# Patient Record
Sex: Female | Born: 1955 | Race: White | Hispanic: No | Smoking: Current every day smoker
Health system: Southern US, Community
[De-identification: ages and names within clinical notes are randomized; demographics above are authoritative.]

## PROBLEM LIST (undated history)

## (undated) DIAGNOSIS — F32A Depression, unspecified: Secondary | ICD-10-CM

## (undated) DIAGNOSIS — E119 Type 2 diabetes mellitus without complications: Secondary | ICD-10-CM

## (undated) DIAGNOSIS — E785 Hyperlipidemia, unspecified: Secondary | ICD-10-CM

## (undated) DIAGNOSIS — J189 Pneumonia, unspecified organism: Secondary | ICD-10-CM

## (undated) DIAGNOSIS — F329 Major depressive disorder, single episode, unspecified: Secondary | ICD-10-CM

## (undated) DIAGNOSIS — M797 Fibromyalgia: Secondary | ICD-10-CM

## (undated) DIAGNOSIS — I10 Essential (primary) hypertension: Secondary | ICD-10-CM

## (undated) HISTORY — PX: TUBAL LIGATION: SHX77

## (undated) HISTORY — PX: CARDIAC CATHETERIZATION: SHX172

## (undated) HISTORY — PX: TONSILLECTOMY: SUR1361

## (undated) HISTORY — PX: CARPAL TUNNEL RELEASE: SHX101

## (undated) HISTORY — PX: FRACTURE SURGERY: SHX138

## (undated) SURGERY — Surgical Case
Anesthesia: *Unknown

---

## 2012-09-02 ENCOUNTER — Inpatient Hospital Stay (HOSPITAL_COMMUNITY)
Admission: EM | Admit: 2012-09-02 | Discharge: 2012-09-06 | DRG: 871 | Disposition: A | Discharge status: Home / Self Care | Payer: Medicare Other | Attending: Internal Medicine | Admitting: Internal Medicine

## 2012-09-02 ENCOUNTER — Emergency Department (HOSPITAL_COMMUNITY): Payer: Medicare Other

## 2012-09-02 ENCOUNTER — Encounter (HOSPITAL_COMMUNITY): Payer: Self-pay

## 2012-09-02 DIAGNOSIS — A419 Sepsis, unspecified organism: Principal | ICD-10-CM | POA: Diagnosis present

## 2012-09-02 DIAGNOSIS — I10 Essential (primary) hypertension: Secondary | ICD-10-CM

## 2012-09-02 DIAGNOSIS — F32A Depression, unspecified: Secondary | ICD-10-CM | POA: Diagnosis present

## 2012-09-02 DIAGNOSIS — R6510 Systemic inflammatory response syndrome (SIRS) of non-infectious origin without acute organ dysfunction: Secondary | ICD-10-CM | POA: Diagnosis present

## 2012-09-02 DIAGNOSIS — J189 Pneumonia, unspecified organism: Secondary | ICD-10-CM | POA: Diagnosis present

## 2012-09-02 DIAGNOSIS — IMO0002 Reserved for concepts with insufficient information to code with codable children: Secondary | ICD-10-CM | POA: Diagnosis present

## 2012-09-02 DIAGNOSIS — R748 Abnormal levels of other serum enzymes: Secondary | ICD-10-CM | POA: Diagnosis present

## 2012-09-02 DIAGNOSIS — Z886 Allergy status to analgesic agent status: Secondary | ICD-10-CM

## 2012-09-02 DIAGNOSIS — Z79899 Other long term (current) drug therapy: Secondary | ICD-10-CM

## 2012-09-02 DIAGNOSIS — B370 Candidal stomatitis: Secondary | ICD-10-CM | POA: Diagnosis present

## 2012-09-02 DIAGNOSIS — I129 Hypertensive chronic kidney disease with stage 1 through stage 4 chronic kidney disease, or unspecified chronic kidney disease: Secondary | ICD-10-CM | POA: Diagnosis present

## 2012-09-02 DIAGNOSIS — F329 Major depressive disorder, single episode, unspecified: Secondary | ICD-10-CM

## 2012-09-02 DIAGNOSIS — R6520 Severe sepsis without septic shock: Secondary | ICD-10-CM | POA: Diagnosis present

## 2012-09-02 DIAGNOSIS — F419 Anxiety disorder, unspecified: Secondary | ICD-10-CM | POA: Diagnosis present

## 2012-09-02 DIAGNOSIS — E1129 Type 2 diabetes mellitus with other diabetic kidney complication: Secondary | ICD-10-CM | POA: Diagnosis present

## 2012-09-02 DIAGNOSIS — E1165 Type 2 diabetes mellitus with hyperglycemia: Secondary | ICD-10-CM | POA: Diagnosis present

## 2012-09-02 DIAGNOSIS — F411 Generalized anxiety disorder: Secondary | ICD-10-CM | POA: Diagnosis present

## 2012-09-02 DIAGNOSIS — E860 Dehydration: Secondary | ICD-10-CM | POA: Diagnosis present

## 2012-09-02 DIAGNOSIS — F17200 Nicotine dependence, unspecified, uncomplicated: Secondary | ICD-10-CM | POA: Diagnosis present

## 2012-09-02 DIAGNOSIS — E876 Hypokalemia: Secondary | ICD-10-CM | POA: Diagnosis not present

## 2012-09-02 DIAGNOSIS — Z880 Allergy status to penicillin: Secondary | ICD-10-CM

## 2012-09-02 DIAGNOSIS — R4182 Altered mental status, unspecified: Secondary | ICD-10-CM

## 2012-09-02 DIAGNOSIS — R509 Fever, unspecified: Secondary | ICD-10-CM

## 2012-09-02 DIAGNOSIS — N058 Unspecified nephritic syndrome with other morphologic changes: Secondary | ICD-10-CM | POA: Diagnosis present

## 2012-09-02 DIAGNOSIS — IMO0001 Reserved for inherently not codable concepts without codable children: Secondary | ICD-10-CM | POA: Diagnosis present

## 2012-09-02 DIAGNOSIS — G9340 Encephalopathy, unspecified: Secondary | ICD-10-CM | POA: Diagnosis present

## 2012-09-02 DIAGNOSIS — F3289 Other specified depressive episodes: Secondary | ICD-10-CM | POA: Diagnosis present

## 2012-09-02 DIAGNOSIS — N179 Acute kidney failure, unspecified: Secondary | ICD-10-CM | POA: Diagnosis present

## 2012-09-02 DIAGNOSIS — N390 Urinary tract infection, site not specified: Secondary | ICD-10-CM | POA: Diagnosis present

## 2012-09-02 DIAGNOSIS — N189 Chronic kidney disease, unspecified: Secondary | ICD-10-CM | POA: Diagnosis present

## 2012-09-02 DIAGNOSIS — E785 Hyperlipidemia, unspecified: Secondary | ICD-10-CM | POA: Diagnosis present

## 2012-09-02 HISTORY — DX: Hyperlipidemia, unspecified: E78.5

## 2012-09-02 HISTORY — DX: Pneumonia, unspecified organism: J18.9

## 2012-09-02 HISTORY — DX: Fibromyalgia: M79.7

## 2012-09-02 HISTORY — DX: Essential (primary) hypertension: I10

## 2012-09-02 HISTORY — DX: Depression, unspecified: F32.A

## 2012-09-02 HISTORY — DX: Type 2 diabetes mellitus without complications: E11.9

## 2012-09-02 HISTORY — DX: Major depressive disorder, single episode, unspecified: F32.9

## 2012-09-02 LAB — COMPREHENSIVE METABOLIC PANEL
Alkaline Phosphatase: 64 U/L (ref 39–117)
BUN: 61 mg/dL — ABNORMAL HIGH (ref 6–23)
Chloride: 104 mEq/L (ref 96–112)
Creatinine, Ser: 4.3 mg/dL — ABNORMAL HIGH (ref 0.50–1.10)
GFR calc Af Amer: 12 mL/min — ABNORMAL LOW (ref >90)
Glucose, Bld: 188 mg/dL — ABNORMAL HIGH (ref 70–99)
Potassium: 4 mEq/L (ref 3.5–5.1)
Total Bilirubin: 0.7 mg/dL (ref 0.3–1.2)

## 2012-09-02 LAB — CBC WITH DIFFERENTIAL/PLATELET
Basophils Absolute: 0 10*3/uL (ref 0.0–0.1)
Basophils Relative: 0 % (ref 0–1)
HCT: 38.3 % (ref 36.0–46.0)
Lymphocytes Relative: 18 % (ref 12–46)
MCHC: 33.2 g/dL (ref 30.0–36.0)
Neutro Abs: 5.7 10*3/uL (ref 1.7–7.7)
Neutrophils Relative %: 65 % (ref 43–77)
Platelets: 159 10*3/uL (ref 150–400)
RDW: 14.5 % (ref 11.5–15.5)
WBC: 8.8 10*3/uL (ref 4.0–10.5)

## 2012-09-02 LAB — URINALYSIS, ROUTINE W REFLEX MICROSCOPIC
Glucose, UA: NEGATIVE mg/dL
Protein, ur: 30 mg/dL — AB
Specific Gravity, Urine: >1.03 — ABNORMAL HIGH (ref 1.005–1.030)

## 2012-09-02 LAB — TROPONIN I: Troponin I: <0.3 ng/mL (ref <0.30)

## 2012-09-02 LAB — URINE MICROSCOPIC-ADD ON

## 2012-09-02 LAB — RAPID URINE DRUG SCREEN, HOSP PERFORMED
Cocaine: NOT DETECTED
Opiates: NOT DETECTED
Tetrahydrocannabinol: NOT DETECTED

## 2012-09-02 LAB — SALICYLATE LEVEL: Salicylate Lvl: <2 mg/dL — ABNORMAL LOW (ref 2.8–20.0)

## 2012-09-02 LAB — ACETAMINOPHEN LEVEL: Acetaminophen (Tylenol), Serum: <15 ug/mL (ref 10–30)

## 2012-09-02 LAB — GLUCOSE, CAPILLARY: Glucose-Capillary: 155 mg/dL — ABNORMAL HIGH (ref 70–99)

## 2012-09-02 LAB — CK: Total CK: 445 U/L — ABNORMAL HIGH (ref 7–177)

## 2012-09-02 LAB — LACTIC ACID, PLASMA: Lactic Acid, Venous: 0.9 mmol/L (ref 0.5–2.2)

## 2012-09-02 MED ORDER — INSULIN ASPART 100 UNIT/ML ~~LOC~~ SOLN
0.0000 [IU] | SUBCUTANEOUS | Status: DC
  Administered 2012-09-02 – 2012-09-03 (×2): 2 [IU] via SUBCUTANEOUS
  Administered 2012-09-03 (×2): 1 [IU] via SUBCUTANEOUS

## 2012-09-02 MED ORDER — NALOXONE HCL 0.4 MG/ML IJ SOLN
INTRAMUSCULAR | Status: AC
  Administered 2012-09-02: 0.4 mg via INTRAVENOUS
  Filled 2012-09-02: qty 1

## 2012-09-02 MED ORDER — SODIUM CHLORIDE 0.9 % IV SOLN
INTRAVENOUS | Status: DC
  Administered 2012-09-02 – 2012-09-03 (×3): via INTRAVENOUS
  Administered 2012-09-04: 1000 mL via INTRAVENOUS
  Administered 2012-09-04 – 2012-09-05 (×2): via INTRAVENOUS

## 2012-09-02 MED ORDER — ONDANSETRON HCL 4 MG/2ML IJ SOLN: 4.0000 mg | Freq: Four times a day (QID) | INTRAMUSCULAR | Status: DC | PRN

## 2012-09-02 MED ORDER — ENOXAPARIN SODIUM 30 MG/0.3ML ~~LOC~~ SOLN
30.0000 mg | SUBCUTANEOUS | Status: DC
  Administered 2012-09-02: 30 mg via SUBCUTANEOUS
  Filled 2012-09-02: qty 0.3

## 2012-09-02 MED ORDER — ALBUTEROL SULFATE (5 MG/ML) 0.5% IN NEBU: 2.5000 mg | INHALATION_SOLUTION | RESPIRATORY_TRACT | Status: DC | PRN

## 2012-09-02 MED ORDER — SODIUM CHLORIDE 0.9 % IV SOLN: INTRAVENOUS | Status: AC

## 2012-09-02 MED ORDER — ACETAMINOPHEN 325 MG PO TABS
650.0000 mg | ORAL_TABLET | Freq: Four times a day (QID) | ORAL | Status: DC | PRN
  Administered 2012-09-03 – 2012-09-05 (×4): 650 mg via ORAL
  Filled 2012-09-02 (×4): qty 2

## 2012-09-02 MED ORDER — SODIUM CHLORIDE 0.9 % IV SOLN: INTRAVENOUS | Status: DC

## 2012-09-02 MED ORDER — CIPROFLOXACIN IN D5W 400 MG/200ML IV SOLN
400.0000 mg | INTRAVENOUS | Status: DC
  Administered 2012-09-03: 400 mg via INTRAVENOUS
  Filled 2012-09-02 (×3): qty 200

## 2012-09-02 MED ORDER — ACETAMINOPHEN 500 MG PO TABS
1000.0000 mg | ORAL_TABLET | Freq: Once | ORAL | Status: AC
  Administered 2012-09-02: 1000 mg via ORAL
  Filled 2012-09-02: qty 2

## 2012-09-02 MED ORDER — CIPROFLOXACIN IN D5W 400 MG/200ML IV SOLN
400.0000 mg | Freq: Once | INTRAVENOUS | Status: AC
  Administered 2012-09-02: 400 mg via INTRAVENOUS
  Filled 2012-09-02: qty 200

## 2012-09-02 MED ORDER — ONDANSETRON HCL 4 MG PO TABS: 4.0000 mg | ORAL_TABLET | Freq: Four times a day (QID) | ORAL | Status: DC | PRN

## 2012-09-02 MED ORDER — NALOXONE HCL 0.4 MG/ML IJ SOLN: 0.4000 mg | Freq: Once | INTRAMUSCULAR | Status: AC

## 2012-09-02 MED ORDER — SODIUM CHLORIDE 0.9 % IV BOLUS (SEPSIS)
500.0000 mL | Freq: Once | INTRAVENOUS | Status: AC
  Administered 2012-09-02: 500 mL via INTRAVENOUS

## 2012-09-02 NOTE — ED Notes (Signed)
EMS reports pt very drowsy.  Reports son found pt on floor around 4am naked, yelling for her sister to help her.  Reports her sister lives in [place].  Reports yesterday pt was c/o feeling very cold.  Oral temp today 101.1 per EMS.  Also says pt was saying that she had been  talking to her sister on the phone yesterday, but pt doesn't have a phone.  EMS reports history of heroin addiction.  EMS says pt told them that she has only taken 2 percocet last night around 9:30.    Pt has recently been in [facility] for pneumonia.

## 2012-09-02 NOTE — Progress Notes (Signed)
ANTIBIOTIC CONSULT NOTE  Pharmacy Consult for Cipro Indication: UTI/SIRS  Allergies  Allergen Reactions  . Penicillins Anaphylaxis  . Nsaids Other (See Comments)    Throat swells.    Patient Measurements: Height: 5\' 7"  (170.2 cm) Weight: 251 lb (113.853 kg) IBW/kg (Calculated) : 61.6  Vital Signs: Temp: 98.8 F (37.1 C) (03/14 1547) Temp src: Oral (03/14 1547) BP: 153/59 mmHg (03/14 1547) Pulse Rate: 88 (03/14 1547) Intake/Output from previous day:   Intake/Output from this shift: Total I/O In: 120 [P.O.:120] Out: -   Labs:  Recent Labs  09/02/12 1036 09/02/12 1037  WBC 8.8  --   HGB 12.7  --   PLT 159  --   CREATININE  --  4.30*   Estimated Creatinine Clearance: 19.5 ml/min (by C-G formula based on Cr of 4.3). No results found for this basename: VANCOTROUGH, Leodis Binet, VANCORANDOM, GENTTROUGH, GENTPEAK, GENTRANDOM, TOBRATROUGH, TOBRAPEAK, TOBRARND, AMIKACINPEAK, AMIKACINTROU, AMIKACIN,  in the last 72 hours   Microbiology: Recent Results (from the past 720 hour(s))  CULTURE, BLOOD (ROUTINE X 2)     Status: None   Collection Time    09/02/12 12:57 PM      Result Value Range Status   Specimen Description Blood RIGHT ARM   Final   Special Requests BOTTLES DRAWN AEROBIC AND ANAEROBIC 8 CC EACH   Final   Culture PENDING   Incomplete   Report Status PENDING   Incomplete  CULTURE, BLOOD (ROUTINE X 2)     Status: None   Collection Time    09/02/12  1:10 PM      Result Value Range Status   Specimen Description Blood LEFT HAND   Final   Special Requests BOTTLES DRAWN AEROBIC ONLY 8 CC   Final   Culture PENDING   Incomplete   Report Status PENDING   Incomplete    Medical History: Past Medical History  Diagnosis Date  . Fibromyalgia   . Hypertension   . Hyperlipidemia   . Pneumonia   . Diabetes mellitus without complication   . Depression     Medications:  Scheduled:  . sodium chloride   Intravenous STAT  . [COMPLETED] acetaminophen  1,000 mg Oral  Once  . [COMPLETED] ciprofloxacin  400 mg Intravenous Once  . enoxaparin (LOVENOX) injection  30 mg Subcutaneous Q24H  . insulin aspart  0-9 Units Subcutaneous Q4H  . [COMPLETED] naloxone  0.4 mg Intravenous Once  . [COMPLETED] sodium chloride  500 mL Intravenous Once   Assessment: Okay for Protocol Estimated Creatinine Clearance: 19.5 ml/min (by C-G formula based on Cr of 4.3). Patient received 400mg  IV Cipro earlier today.  Goal of Therapy:  Eradicate infection.  Plan:  Cipro 400mg  IV every 24 hours. Follow up culture results  Mady Gemma 09/02/2012,6:46 PM

## 2012-09-02 NOTE — ED Provider Notes (Signed)
History     CSN: [id number]  Arrival date & time 09/02/12  1016   First MD Initiated Contact with Patient 09/02/12 1034      Chief Complaint  Patient presents with  . Altered Mental Status     HPI Pt was seen at 1020.   Per EMS, family and pt report, c/o gradual onset and persistence of constant generalized fatigue and "chills" since last night.  Pt's family states they found her on the floor this morning "naked and yelling for help."  Pt states she was recently discharged from [facility] the first week of February for dx pneumonia.  Pt does endorse hx of chronic pain, with LD percocet last night.  Denies CP/palpitations, no SOB/cough, no abd pain, no N/V/D, no back pain, no fevers, no rash, no syncope.     Past Medical History  Diagnosis Date  . Fibromyalgia   . Hypertension   . Hyperlipidemia   . Pneumonia   . Diabetes mellitus without complication   . Depression     History reviewed. No pertinent past surgical history.   History  Substance Use Topics  . Smoking status: Never Smoker   . Smokeless tobacco: Not on file  . Alcohol Use: No      Review of Systems ROS: Statement: All systems negative except as marked or noted in the HPI; Constitutional: Negative for fever and +chills, fatigue. ; ; Eyes: Negative for eye pain, redness and discharge. ; ; ENMT: Negative for ear pain, hoarseness, nasal congestion, sinus pressure and sore throat. ; ; Cardiovascular: Negative for chest pain, palpitations, diaphoresis, dyspnea and peripheral edema. ; ; Respiratory: Negative for cough, wheezing and stridor. ; ; Gastrointestinal: Negative for nausea, vomiting, diarrhea, abdominal pain, blood in stool, hematemesis, jaundice and rectal bleeding. . ; ; Genitourinary: Negative for dysuria, flank pain and hematuria. ; ; Musculoskeletal: Negative for back pain and neck pain. Negative for swelling and trauma.; ; Skin: Negative for pruritus, rash, abrasions, blisters, bruising and skin lesion.; ;  Neuro: Negative for headache, lightheadedness and neck stiffness. Negative for weakness, altered level of consciousness , altered mental status, extremity weakness, paresthesias, involuntary movement, seizure and syncope.       Allergies  Penicillins and Nsaids  Home Medications   Current Outpatient Rx  Name  Route  Sig  Dispense  Refill  . amLODipine (NORVASC) 10 MG tablet   Oral   Take 10 mg by mouth daily.         Marland Kitchen atorvastatin (LIPITOR) 40 MG tablet   Oral   Take 40 mg by mouth daily.         . benazepril-hydrochlorthiazide (LOTENSIN HCT) 20-25 MG per tablet   Oral   Take 1 tablet by mouth 2 (two) times daily.         . carvedilol (COREG) 25 MG tablet   Oral   Take 25 mg by mouth 2 (two) times daily with a meal.         . clonazePAM (KLONOPIN) 2 MG tablet   Oral   Take 2 mg by mouth 2 (two) times daily.         . cloNIDine (CATAPRES) 0.3 MG tablet   Oral   Take 0.3 mg by mouth 2 (two) times daily.         Marland Kitchen losartan (COZAAR) 100 MG tablet   Oral   Take 100 mg by mouth daily.         . metFORMIN (GLUCOPHAGE-XR) 500 MG 24  hr tablet   Oral   Take 1,000 mg by mouth 2 (two) times daily.         Marland Kitchen oxyCODONE-acetaminophen (PERCOCET) 10-325 MG per tablet   Oral   Take 1 tablet by mouth 4 (four) times daily.         . sertraline (ZOLOFT) 100 MG tablet   Oral   Take 200 mg by mouth daily.           BP 125/58  Pulse 100  Temp(Src) 100.3 F (37.9 C) (Oral)  Resp 23  SpO2 91%  Physical Exam 1025: Physical examination:  Nursing notes reviewed; Vital signs and O2 SAT reviewed;  Constitutional: Well developed, Well nourished, In no acute distress; Head:  Normocephalic, atraumatic; Eyes: EOMI, PERRL, No scleral icterus; ENMT: Mouth and pharynx normal, Mucous membranes dry. +lips and tongue dry and cracked.; Neck: Supple, Full range of motion, No lymphadenopathy. No meningeal signs; Cardiovascular: Regular rate and rhythm, No murmur, rub, or  gallop; Respiratory: Breath sounds clear & equal bilaterally, No rales, rhonchi, wheezes.  Speaking full sentences with ease, Normal respiratory effort/excursion; Chest: Nontender, Movement normal; Abdomen: Soft, Nontender, Nondistended, Normal bowel sounds; Genitourinary: No CVA tenderness; Extremities: Pulses normal, No tenderness, No edema, No calf edema or asymmetry.; Neuro: Lethargic, but opens her eyes to her name and answers questions appropriately.  Speech slurred. No facial droop. Major CN grossly intact.  Moves all ext on stretcher spontaneously and to command without apparent gross focal motor deficits.; Skin: Color normal, Warm, Dry.   ED Course  Procedures   1100:  Pt lethargic with slurred speech on arrival.  EMS notes pt's family told them she had hx of heroin addiction.  IV narcan given with good effect.  Pt now awake, alert, asking for "something to eat."   1255:  Pt continues awake/alert, talking with ED staff appropriately.  Taking PO fluids well. Rectal temp 101.3; will dose tylenol.  +UTI, UC pending.  VS remain stable.  Pt states her PMD is in [facility] and she was admitted at [facility] the first week of Feb with dx pneumonia.  Has been staying with family members since then, was with a local family member last night.  Dx and testing d/w pt.  Questions answered.  Verb understanding, agreeable to admit.  T/C to [facility]. Rito Ehrlich, case discussed, including:  HPI, pertinent PM/SHx, VS/PE, dx testing, ED course and treatment:  Agreeable to admit, requests to start IV cipro for UTI after obtain UC and [facility] x2, write temporary orders, obtain medical bed to team 1.   MDM  MDM Reviewed: nursing note and vitals Interpretation: labs, ECG, x-ray and CT scan Total time providing critical care: 30-74 minutes. This excludes time spent performing separately reportable procedures and services. Consults: admitting MD   CRITICAL CARE Performed by: Laray Anger Total critical care time:  40 Critical care time was exclusive of separately billable procedures and treating other patients. Critical care was necessary to treat or prevent imminent or life-threatening deterioration. Critical care was time spent personally by me on the following activities: development of treatment plan with patient and/or surrogate as well as nursing, discussions with consultants, evaluation of patient's response to treatment, examination of patient, obtaining history from patient or surrogate, ordering and performing treatments and interventions, ordering and review of laboratory studies, ordering and review of radiographic studies, pulse oximetry and re-evaluation of patient's condition.    Date: 09/02/2012  Rate: 100  Rhythm: sinus tachycardia  QRS Axis: normal  Intervals: normal  ST/T Wave abnormalities: normal  Conduction Disutrbances:none  Narrative Interpretation:   Old EKG Reviewed: none available.  Results for orders placed during the hospital encounter of 09/02/12  CBC WITH DIFFERENTIAL      Result Value Range   WBC 8.8  4.0 - 10.5 K/uL   RBC 4.09  3.87 - 5.11 MIL/uL   Hemoglobin 12.7  12.0 - 15.0 g/dL   HCT 29.5  28.4 - 13.2 %   MCV 93.6  78.0 - 100.0 fL   MCH 31.1  26.0 - 34.0 pg   MCHC 33.2  30.0 - 36.0 g/dL   RDW 44.0  10.2 - 72.5 %   Platelets 159  150 - 400 K/uL   Neutrophils Relative 65  43 - 77 %   Neutro Abs 5.7  1.7 - 7.7 K/uL   Lymphocytes Relative 18  12 - 46 %   Lymphs Abs 1.6  0.7 - 4.0 K/uL   Monocytes Relative 16 (*) 3 - 12 %   Monocytes Absolute 1.4 (*) 0.1 - 1.0 K/uL   Eosinophils Relative 1  0 - 5 %   Eosinophils Absolute 0.1  0.0 - 0.7 K/uL   Basophils Relative 0  0 - 1 %   Basophils Absolute 0.0  0.0 - 0.1 K/uL  ACETAMINOPHEN LEVEL      Result Value Range   Acetaminophen (Tylenol), Serum <15.0  10 - 30 ug/mL  SALICYLATE LEVEL      Result Value Range   Salicylate Lvl <2.0 (*) 2.8 - 20.0 mg/dL  URINE RAPID DRUG SCREEN (HOSP PERFORMED)      Result Value  Range   Opiates NONE DETECTED  NONE DETECTED   Cocaine NONE DETECTED  NONE DETECTED   Benzodiazepines POSITIVE (*) NONE DETECTED   Amphetamines NONE DETECTED  NONE DETECTED   Tetrahydrocannabinol NONE DETECTED  NONE DETECTED   Barbiturates NONE DETECTED  NONE DETECTED  ETHANOL      Result Value Range   Alcohol, Ethyl (B) <11  0 - 11 mg/dL  URINALYSIS, ROUTINE W REFLEX MICROSCOPIC      Result Value Range   Color, Urine YELLOW  YELLOW   APPearance CLEAR  CLEAR   Specific Gravity, Urine >1.030 (*) 1.005 - 1.030   pH 5.5  5.0 - 8.0   Glucose, UA NEGATIVE  NEGATIVE mg/dL   Hgb urine dipstick LARGE (*) NEGATIVE   Bilirubin Urine NEGATIVE  NEGATIVE   Ketones, ur NEGATIVE  NEGATIVE mg/dL   Protein, ur 30 (*) NEGATIVE mg/dL   Urobilinogen, UA 0.2  0.0 - 1.0 mg/dL   Nitrite NEGATIVE  NEGATIVE   Leukocytes, UA TRACE (*) NEGATIVE  COMPREHENSIVE METABOLIC PANEL      Result Value Range   Sodium 140  135 - 145 mEq/L   Potassium 4.0  3.5 - 5.1 mEq/L   Chloride 104  96 - 112 mEq/L   CO2 21  19 - 32 mEq/L   Glucose, Bld 188 (*) 70 - 99 mg/dL   BUN 61 (*) 6 - 23 mg/dL   Creatinine, Ser 3.66 (*) 0.50 - 1.10 mg/dL   Calcium 8.7  8.4 - 44.0 mg/dL   Total Protein 6.9  6.0 - 8.3 g/dL   Albumin 3.4 (*) 3.5 - 5.2 g/dL   AST 32  0 - 37 U/L   ALT 54 (*) 0 - 35 U/L   Alkaline Phosphatase 64  39 - 117 U/L   Total Bilirubin 0.7  0.3 - 1.2  mg/dL   GFR calc non Af Amer 11 (*) >90 mL/min   GFR calc Af Amer 12 (*) >90 mL/min  TROPONIN I      Result Value Range   Troponin I <0.30  <0.30 ng/mL  LACTIC ACID, PLASMA      Result Value Range   Lactic Acid, Venous 0.9  0.5 - 2.2 mmol/L  URINE MICROSCOPIC-ADD ON      Result Value Range   Squamous Epithelial / LPF FEW (*) RARE   WBC, UA TOO NUMEROUS TO COUNT  <3 WBC/hpf   RBC / HPF TOO NUMEROUS TO COUNT  <3 RBC/hpf   Bacteria, UA MANY (*) RARE   Urine-Other YEAST    CK      Result Value Range   Total CK 445 (*) 7 - 177 U/L   Ct Head Wo  Contrast 09/02/2012  *RADIOLOGY REPORT*  Clinical Data: Altered mental status  CT HEAD WITHOUT CONTRAST  Technique:  Contiguous axial images were obtained from the base of the skull through the vertex without contrast.  Comparison: None.  Findings: Image quality degraded by motion.  Multiple images were repeated.  Ventricle size is normal.  Patchy hypodensity in the cerebral white matter bilaterally, consistent with chronic microvascular ischemia. 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  Negative for acute infarct.  Negative for hemorrhage or mass lesion.  Calvarium is intact.  IMPRESSION: Chronic microvascular ischemia.  No acute abnormality.   Original Report Authenticated By: Janeece Riggers, M.D.    Dg Chest Portable 1 View 09/02/2012  *RADIOLOGY REPORT*  Clinical Data: Altered mental status.  PORTABLE CHEST - 1 VIEW  Comparison: None.  Findings: There is cardiomegaly without edema.  There is some airspace disease in the right lung base.  Left lung appears clear. No pneumothorax or pleural effusion.  IMPRESSION:  1.  Right basilar airspace disease could be due to atelectasis, aspiration or pneumonia. 2.  Cardiomegaly.   Original Report Authenticated By: Holley Dexter, M.D.              Laray Anger, DO 09/04/12 1237

## 2012-09-02 NOTE — ED Notes (Signed)
Requesting nourishment, edp notified, npo for now, patient informed

## 2012-09-02 NOTE — H&P (Signed)
[facility] History and Physical  Gail Preston ZOX:[id number] DOB: [birth year] DOA: 09/02/2012  Referring physician: Eden Emms, ER physician PCP: Unclear who her PCP is  Specialists: None  Chief Complaint: Altered mental status  HPI: Gail Preston is a 57 y.o. female  Past medical history of recently treated pneumonia at outside hospital last month. She has been complaining of according to family in the emergency room of generalized fatigue and chills. Patient was found this morning confused and calling for help. She was brought into the emergency room nothing was able to relate somewhat of a store. States that she took an extra Percocet the night before. Urine drug screen interestingly noted benzodiazepines but no Percocet. Chest x-ray noted pneumonia but also cardiomegaly. White count and other lab work was normal with the exception of her renal function which was markedly elevated with a BUN of 61 and a creatinine of 4.3. It is of note that we have no previous labs to compare to. Urine also noted a very large UTI. Patient was given Narcan in the emergency room which immediately led to more awake and alert and with clarity. Hospitalists were called for further evaluation and admission. I advised checking blood cultures and skipping one dose of IV Cipro for UTI. She was transferred to the floor she looked to be much more somnolent. Once patient arrived to the floor, no family was present.  Review of Systems: Patient is quite somnolent. Unable to give me any kind of medical history or review of systems   Past Medical History  Diagnosis Date  . Fibromyalgia   . Hypertension   . Hyperlipidemia   . Pneumonia   . Diabetes mellitus without complication   . Depression    History reviewed. No pertinent past surgical history. Social History:  reports that she has been smoking.  She does not have any smokeless tobacco history on file. She reports that she does not drink alcohol or use illicit  drugs. Unable to clarify patient's home situation. Unable to clarify her baseline function.  Allergies  Allergen Reactions  . Penicillins Anaphylaxis  . Nsaids Other (See Comments)    Throat swells.    Family History  Problem Relation Age of Onset  . Cancer Mother   . Heart disease Father     Prior to Admission medications   Medication Sig Start Date End Date Taking? Authorizing Provider  amLODipine (NORVASC) 10 MG tablet Take 10 mg by mouth daily.   Yes Historical Provider, MD  atorvastatin (LIPITOR) 40 MG tablet Take 40 mg by mouth daily.   Yes Historical Provider, MD  benazepril-hydrochlorthiazide (LOTENSIN HCT) 20-25 MG per tablet Take 1 tablet by mouth 2 (two) times daily.   Yes Historical Provider, MD  carvedilol (COREG) 25 MG tablet Take 25 mg by mouth 2 (two) times daily with a meal.   Yes Historical Provider, MD  clonazePAM (KLONOPIN) 2 MG tablet Take 2 mg by mouth 2 (two) times daily.   Yes Historical Provider, MD  cloNIDine (CATAPRES) 0.3 MG tablet Take 0.3 mg by mouth 2 (two) times daily.   Yes Historical Provider, MD  losartan (COZAAR) 100 MG tablet Take 100 mg by mouth daily.   Yes Historical Provider, MD  metFORMIN (GLUCOPHAGE-XR) 500 MG 24 hr tablet Take 1,000 mg by mouth 2 (two) times daily.   Yes Historical Provider, MD  oxyCODONE-acetaminophen (PERCOCET) 10-325 MG per tablet Take 1 tablet by mouth 4 (four) times daily.   Yes Historical Provider, MD  sertraline (ZOLOFT)  100 MG tablet Take 200 mg by mouth daily.   Yes Historical Provider, MD   Physical Exam: Filed Vitals:   09/02/12 1230 09/02/12 1240 09/02/12 1300 09/02/12 1547  BP:  137/69 136/75 153/59  Pulse:  102  88  Temp: 101.6 F (38.7 C) 101.3 F (38.5 C)  98.8 F (37.1 C)  TempSrc:  Rectal  Oral  Resp:   21 20  Height:    5\' 7"  (1.702 m)  Weight:    113.853 kg (251 lb)  SpO2:  97%  91%     General:  Drowsy, somnolent, awakens breathing is back to sleep. Unable to communicate with me at this  time  Eyes: Sclera nonicteric, extraocular movements intact  ENT: Normocephalic, atraumatic mucous membranes are dry  Neck: Supple  Cardiovascular: Regular rate and rhythm, S1-S2, 2/6 systolic ejection murmur  Respiratory: Scattered rails  Abdomen: Soft, nondistended, hypoactive bowel sounds  Skin: Dry  Musculoskeletal: No clubbing or cyanosis or edema  Psychiatric: Patient is acutely somnolent, question delirium  Neurologic: Unable to get any cooperation for exam  Labs on Admission:  Basic Metabolic Panel:  Recent Labs Lab 09/02/12 1037  NA 140  K 4.0  CL 104  CO2 21  GLUCOSE 188*  BUN 61*  CREATININE 4.30*  CALCIUM 8.7   Liver Function Tests:  Recent Labs Lab 09/02/12 1037  AST 32  ALT 54*  ALKPHOS 64  BILITOT 0.7  PROT 6.9  ALBUMIN 3.4*   CBC:  Recent Labs Lab 09/02/12 1036  WBC 8.8  NEUTROABS 5.7  HGB 12.7  HCT 38.3  MCV 93.6  PLT 159   Cardiac Enzymes:  Recent Labs Lab 09/02/12 1037  CKTOTAL 445*  TROPONINI <0.30     Radiological Exams on Admission: Ct Head Wo Contrast  09/02/2012    IMPRESSION: Chronic microvascular ischemia.  No acute abnormality.   Original Report Authenticated By: Janeece Riggers, M.D.    Dg Chest Portable 1 View  09/02/2012   IMPRESSION:  1.  Right basilar airspace disease could be due to atelectasis, aspiration or pneumonia. 2.  Cardiomegaly.   Original Report Authenticated By: Holley Dexter, M.D.     EKG: Independently reviewed. Normal sinus rhythm  Assessment/Plan Principal Problem:   SIRS (systemic inflammatory response syndrome): Likely secondary UTI Active Problems:   ARF (acute renal failure): Suspect acute on chronic renal failure. Unclear baseline. Acute failure from dehydration brought on by UTI.   UTI (urinary tract infection): IV Cipro. Patient is allergic to penicillin. Renally dose.   Altered mental status: Suspect patient's benzodiazepines/opiates have persisted in her system because of  acute on chronic renal failure and decreased clearance. She responded to Narcan. Continue to hold benzos and opiates until she is much more fully awake and alert. Hypertension: Holding oral medications given severe dehydration. Hyperlipidemia: Holding all meds until she is more alert. Question congestive heart failure: Suspect patient likely has history of heart failure. No previous labs to go up and she is in no condition to tell us. Would favor hydrating her until we get her renal function improved. Recent pneumonia: Suspect x-ray is resolving previous pneumonia. At this time would not treat as pneumonia in any form. White count normal no fever. Her altered mental status is much more likely from urinary tract infection which led to increasing renal failure which led to decreased clearance of benzos and opiates.  Code Status: Presumed full code. No family present to clarify.  Family Communication: No family at bedside. There is  no documentation in the medical record for phone numbers. Patient has a son named Thayer Ohm but no phone number.  Disposition Plan: Likely in the hospital for several days.  Time spent: 30 minutes  Hollice Espy [facility] Pager [phone]  If 7PM-7AM, please contact night-coverage [email or web address] Password [facility] 09/02/2012, 6:30 PM

## 2012-09-03 DIAGNOSIS — I10 Essential (primary) hypertension: Secondary | ICD-10-CM

## 2012-09-03 DIAGNOSIS — J189 Pneumonia, unspecified organism: Secondary | ICD-10-CM

## 2012-09-03 DIAGNOSIS — R748 Abnormal levels of other serum enzymes: Secondary | ICD-10-CM | POA: Diagnosis present

## 2012-09-03 LAB — BASIC METABOLIC PANEL
BUN: 47 mg/dL — ABNORMAL HIGH (ref 6–23)
Chloride: 105 mEq/L (ref 96–112)
GFR calc Af Amer: 33 mL/min — ABNORMAL LOW (ref >90)
GFR calc non Af Amer: 28 mL/min — ABNORMAL LOW (ref >90)
Glucose, Bld: 131 mg/dL — ABNORMAL HIGH (ref 70–99)
Potassium: 3.8 mEq/L (ref 3.5–5.1)
Sodium: 141 mEq/L (ref 135–145)

## 2012-09-03 LAB — CBC
HCT: 35.9 % — ABNORMAL LOW (ref 36.0–46.0)
Hemoglobin: 12 g/dL (ref 12.0–15.0)
MCHC: 33.4 g/dL (ref 30.0–36.0)
RDW: 14.5 % (ref 11.5–15.5)
WBC: 6.1 10*3/uL (ref 4.0–10.5)

## 2012-09-03 LAB — HEMOGLOBIN A1C: Mean Plasma Glucose: 160 mg/dL — ABNORMAL HIGH (ref <117)

## 2012-09-03 LAB — GLUCOSE, CAPILLARY
Glucose-Capillary: 142 mg/dL — ABNORMAL HIGH (ref 70–99)
Glucose-Capillary: 150 mg/dL — ABNORMAL HIGH (ref 70–99)
Glucose-Capillary: 243 mg/dL — ABNORMAL HIGH (ref 70–99)

## 2012-09-03 MED ORDER — SERTRALINE HCL 50 MG PO TABS
200.0000 mg | ORAL_TABLET | Freq: Every day | ORAL | Status: DC
  Administered 2012-09-03 – 2012-09-06 (×4): 200 mg via ORAL
  Filled 2012-09-03 (×4): qty 4

## 2012-09-03 MED ORDER — LEVOFLOXACIN IN D5W 750 MG/150ML IV SOLN
750.0000 mg | INTRAVENOUS | Status: DC
  Administered 2012-09-03: 750 mg via INTRAVENOUS
  Filled 2012-09-03: qty 150

## 2012-09-03 MED ORDER — INSULIN ASPART 100 UNIT/ML ~~LOC~~ SOLN
0.0000 [IU] | Freq: Three times a day (TID) | SUBCUTANEOUS | Status: DC
  Administered 2012-09-03: 1 [IU] via SUBCUTANEOUS
  Administered 2012-09-03: 3 [IU] via SUBCUTANEOUS
  Administered 2012-09-04 (×3): 2 [IU] via SUBCUTANEOUS
  Administered 2012-09-04: 3 [IU] via SUBCUTANEOUS
  Administered 2012-09-05 (×2): 2 [IU] via SUBCUTANEOUS
  Administered 2012-09-05: 1 [IU] via SUBCUTANEOUS
  Administered 2012-09-05 – 2012-09-06 (×2): 2 [IU] via SUBCUTANEOUS
  Administered 2012-09-06: 3 [IU] via SUBCUTANEOUS

## 2012-09-03 MED ORDER — CIPROFLOXACIN IN D5W 400 MG/200ML IV SOLN
400.0000 mg | Freq: Two times a day (BID) | INTRAVENOUS | Status: DC
  Filled 2012-09-03 (×4): qty 200

## 2012-09-03 MED ORDER — AMLODIPINE BESYLATE 5 MG PO TABS
10.0000 mg | ORAL_TABLET | Freq: Every day | ORAL | Status: DC
  Administered 2012-09-03 – 2012-09-06 (×4): 10 mg via ORAL
  Filled 2012-09-03 (×4): qty 2

## 2012-09-03 MED ORDER — HYDROCODONE-ACETAMINOPHEN 5-325 MG PO TABS
1.0000 | ORAL_TABLET | Freq: Four times a day (QID) | ORAL | Status: DC | PRN
  Administered 2012-09-03 – 2012-09-05 (×7): 1 via ORAL
  Filled 2012-09-03 (×7): qty 1

## 2012-09-03 MED ORDER — SODIUM CHLORIDE 0.9 % IV SOLN
2000.0000 mg | Freq: Once | INTRAVENOUS | Status: AC
  Administered 2012-09-03: 2000 mg via INTRAVENOUS
  Filled 2012-09-03: qty 2000

## 2012-09-03 MED ORDER — ENOXAPARIN SODIUM 60 MG/0.6ML ~~LOC~~ SOLN
55.0000 mg | SUBCUTANEOUS | Status: DC
  Administered 2012-09-03 – 2012-09-05 (×3): 55 mg via SUBCUTANEOUS
  Filled 2012-09-03 (×4): qty 0.6

## 2012-09-03 MED ORDER — CARVEDILOL 12.5 MG PO TABS
25.0000 mg | ORAL_TABLET | Freq: Two times a day (BID) | ORAL | Status: DC
  Administered 2012-09-03 – 2012-09-06 (×6): 25 mg via ORAL
  Filled 2012-09-03 (×6): qty 2

## 2012-09-03 MED ORDER — CLONAZEPAM 0.5 MG PO TABS
1.0000 mg | ORAL_TABLET | Freq: Two times a day (BID) | ORAL | Status: DC
  Administered 2012-09-03 – 2012-09-04 (×2): 1 mg via ORAL
  Filled 2012-09-03 (×2): qty 2

## 2012-09-03 MED ORDER — NYSTATIN 100000 UNIT/ML MT SUSP
5.0000 mL | Freq: Four times a day (QID) | OROMUCOSAL | Status: DC
  Administered 2012-09-03 – 2012-09-06 (×11): 500000 [IU] via ORAL
  Filled 2012-09-03 (×11): qty 5

## 2012-09-03 MED ORDER — VANCOMYCIN HCL 10 G IV SOLR
1500.0000 mg | INTRAVENOUS | Status: DC
  Filled 2012-09-03: qty 1500

## 2012-09-03 NOTE — Progress Notes (Addendum)
ANTIBIOTIC CONSULT NOTE  Pharmacy Consult for Cipro Indication: UTI/SIRS  Allergies  Allergen Reactions  . Penicillins Anaphylaxis  . Nsaids Other (See Comments)    Throat swells.    Patient Measurements: Height: 5\' 7"  (170.2 cm) Weight: 251 lb (113.853 kg) IBW/kg (Calculated) : 61.6  Vital Signs: Temp: 99.1 F (37.3 C) (03/15 0500) Temp src: Oral (03/15 0500) BP: 142/66 mmHg (03/15 0500) Pulse Rate: 100 (03/15 0500) Intake/Output from previous day: 03/14 0701 - 03/15 0700 In: 120 [P.O.:120] Out: -  Intake/Output from this shift: Total I/O In: 1576.7 [I.V.:1576.7] Out: -   Labs:  Recent Labs  09/02/12 1036 09/02/12 1037 09/03/12 0511  WBC 8.8  --  6.1  HGB 12.7  --  12.0  PLT 159  --  154  CREATININE  --  4.30* 1.94*   Estimated Creatinine Clearance: 43.2 ml/min (by C-G formula based on Cr of 1.94). No results found for this basename: VANCOTROUGH, Leodis Binet, VANCORANDOM, GENTTROUGH, GENTPEAK, GENTRANDOM, TOBRATROUGH, TOBRAPEAK, TOBRARND, AMIKACINPEAK, AMIKACINTROU, AMIKACIN,  in the last 72 hours   Microbiology: Recent Results (from the past 720 hour(s))  CULTURE, BLOOD (ROUTINE X 2)     Status: None   Collection Time    09/02/12 12:57 PM      Result Value Range Status   Specimen Description Blood RIGHT ARM   Final   Special Requests BOTTLES DRAWN AEROBIC AND ANAEROBIC 8 CC EACH   Final   Culture NO GROWTH 1 DAY   Final   Report Status PENDING   Incomplete  CULTURE, BLOOD (ROUTINE X 2)     Status: None   Collection Time    09/02/12  1:10 PM      Result Value Range Status   Specimen Description Blood LEFT HAND   Final   Special Requests BOTTLES DRAWN AEROBIC ONLY 8 CC   Final   Culture NO GROWTH 1 DAY   Final   Report Status PENDING   Incomplete    Medical History: Past Medical History  Diagnosis Date  . Fibromyalgia   . Hypertension   . Hyperlipidemia   . Pneumonia   . Diabetes mellitus without complication   . Depression     Medications:   Scheduled:  . [COMPLETED] sodium chloride   Intravenous STAT  . [COMPLETED] acetaminophen  1,000 mg Oral Once  . [COMPLETED] ciprofloxacin  400 mg Intravenous Once  . ciprofloxacin  400 mg Intravenous Q12H  . enoxaparin (LOVENOX) injection  30 mg Subcutaneous Q24H  . insulin aspart  0-9 Units Subcutaneous Q4H  . [COMPLETED] sodium chloride  500 mL Intravenous Once  . [DISCONTINUED] ciprofloxacin  400 mg Intravenous Q24H   Assessment: Okay for Protocol Estimated Creatinine Clearance: 43.2 ml/min (by C-G formula based on Cr of 1.94). Renal function improving.  Goal of Therapy:  Eradicate infection.  Plan:  Change Cipro 400mg  IV every 12 hours. Follow up culture results  Mady Gemma 09/03/2012,1:08 PM  Addendum: Added Vanc and switched Cipro to Levaquin. Estimated Creatinine Clearance: 43.2 ml/min (by C-G formula based on Cr of 1.94).  Plan: Levaquin 750mg  IV every 48 hours. Vancomycin 2000mg  IV x 1, then 1500mg  IV every 24 hours. Monitor renal function closely and adjust doses in AM if continues to improve.  Mady Gemma, [facility] 09/03/2012 1:55 PM  Body mass index is 39.3 kg/(m^2).  Lovenox adjusted for obesity.  Mady Gemma, [facility] 09/03/2012 3:01 PM

## 2012-09-03 NOTE — Progress Notes (Signed)
Chart reviewed.  Subjective: Hungry. Has been coughing. Reports having been hospitalized multiple times at [facility]. She reports for pneumonia. She denies having taken medication at home any other than as prescribed. Worried about taking her home medications.  Objective: Vital signs in last 24 hours: Filed Vitals:   09/02/12 2110 09/03/12 0159 09/03/12 0500 09/03/12 0757  BP:   142/66   Pulse:   100   Temp:  100.7 F (38.2 C) 99.1 F (37.3 C)   TempSrc:  Oral Oral   Resp:   18   Height:      Weight:      SpO2: 90%  95% 96%   Weight change:   Intake/Output Summary (Last 24 hours) at 09/03/12 1305 Last data filed at 09/03/12 0852  Gross per 24 hour  Intake 1696.67 ml  Output      0 ml  Net 1696.67 ml   General: Alert. Oriented and appropriate. Coughing frequently. Cooperative. HEENT: White patches on her tongue. Slightly dry mucous membranes. Lungs: Rhonchi bilaterally without wheezes or rales. Cardiovascular regular rate rhythm without murmurs gallops rubs Abdomen obese soft nontender Extremities no clubbing cyanosis or edema  Lab Results: Basic Metabolic Panel:  Recent Labs Lab 09/02/12 1037 09/03/12 0511  NA 140 141  K 4.0 3.8  CL 104 105  CO2 21 21  GLUCOSE 188* 131*  BUN 61* 47*  CREATININE 4.30* 1.94*  CALCIUM 8.7 9.0   Liver Function Tests:  Recent Labs Lab 09/02/12 1037  AST 32  ALT 54*  ALKPHOS 64  BILITOT 0.7  PROT 6.9  ALBUMIN 3.4*   No results found for this basename: LIPASE, AMYLASE,  in the last 168 hours No results found for this basename: AMMONIA,  in the last 168 hours CBC:  Recent Labs Lab 09/02/12 1036 09/03/12 0511  WBC 8.8 6.1  NEUTROABS 5.7  --   HGB 12.7 12.0  HCT 38.3 35.9*  MCV 93.6 94.0  PLT 159 154   Cardiac Enzymes:  Recent Labs Lab 09/02/12 1037  CKTOTAL 445*  TROPONINI <0.30   BNP: No results found for this basename: PROBNP,  in the last 168 hours D-Dimer: No results found for  this basename: DDIMER,  in the last 168 hours CBG:  Recent Labs Lab 09/02/12 2109 09/03/12 0012 09/03/12 0345 09/03/12 0722 09/03/12 1144  GLUCAP 155* 150* 143* 115* 162*   Hemoglobin A1C:  Recent Labs Lab 09/02/12 1036  HGBA1C 7.2*   Fasting Lipid Panel: No results found for this basename: CHOL, HDL, LDLCALC, TRIG, CHOLHDL, LDLDIRECT,  in the last 168 hours Thyroid Function Tests: No results found for this basename: TSH, T4TOTAL, FREET4, T3FREE, THYROIDAB,  in the last 168 hours Coagulation: No results found for this basename: LABPROT, INR,  in the last 168 hours Anemia Panel: No results found for this basename: VITAMINB12, FOLATE, FERRITIN, TIBC, IRON, RETICCTPCT,  in the last 168 hours Urine Drug Screen: Drugs of Abuse     Component Value Date/Time   LABOPIA NONE DETECTED 09/02/2012 1041   COCAINSCRNUR NONE DETECTED 09/02/2012 1041   LABBENZ POSITIVE* 09/02/2012 1041   AMPHETMU NONE DETECTED 09/02/2012 1041   THCU NONE DETECTED 09/02/2012 1041   LABBARB NONE DETECTED 09/02/2012 1041    Alcohol Level:  Recent Labs Lab 09/02/12 1036  ETH <11   Urinalysis:  Recent Labs Lab 09/02/12 1041  COLORURINE YELLOW  LABSPEC >1.030*  PHURINE 5.5  GLUCOSEU NEGATIVE  HGBUR LARGE*  BILIRUBINUR NEGATIVE  KETONESUR NEGATIVE  PROTEINUR 30*  UROBILINOGEN 0.2  NITRITE NEGATIVE  LEUKOCYTESUR TRACE*   Micro Results: Recent Results (from the past 240 hour(s))  CULTURE, BLOOD (ROUTINE X 2)     Status: None   Collection Time    09/02/12 12:57 PM      Result Value Range Status   Specimen Description Blood RIGHT ARM   Final   Special Requests BOTTLES DRAWN AEROBIC AND ANAEROBIC 8 CC EACH   Final   Culture NO GROWTH 1 DAY   Final   Report Status PENDING   Incomplete  CULTURE, BLOOD (ROUTINE X 2)     Status: None   Collection Time    09/02/12  1:10 PM      Result Value Range Status   Specimen Description Blood LEFT HAND   Final   Special Requests BOTTLES DRAWN AEROBIC ONLY  8 CC   Final   Culture NO GROWTH 1 DAY   Final   Report Status PENDING   Incomplete   Studies/Results: Ct Head Wo Contrast  09/02/2012  *RADIOLOGY REPORT*  Clinical Data: Altered mental status  CT HEAD WITHOUT CONTRAST  Technique:  Contiguous axial images were obtained from the base of the skull through the vertex without contrast.  Comparison: None.  Findings: Image quality degraded by motion.  Multiple images were repeated.  Ventricle size is normal.  Patchy hypodensity in the cerebral white matter bilaterally, consistent with chronic microvascular ischemia. 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  Negative for acute infarct.  Negative for hemorrhage or mass lesion.  Calvarium is intact.  IMPRESSION: Chronic microvascular ischemia.  No acute abnormality.   Original Report Authenticated By: Janeece Riggers, M.D.    Dg Chest Portable 1 View  09/02/2012  *RADIOLOGY REPORT*  Clinical Data: Altered mental status.  PORTABLE CHEST - 1 VIEW  Comparison: None.  Findings: There is cardiomegaly without edema.  There is some airspace disease in the right lung base.  Left lung appears clear. No pneumothorax or pleural effusion.  IMPRESSION:  1.  Right basilar airspace disease could be due to atelectasis, aspiration or pneumonia. 2.  Cardiomegaly.   Original Report Authenticated By: Holley Dexter, M.D.    Scheduled Meds: . ciprofloxacin  400 mg Intravenous Q24H  . enoxaparin (LOVENOX) injection  30 mg Subcutaneous Q24H  . insulin aspart  0-9 Units Subcutaneous Q4H   Continuous Infusions: . sodium chloride 100 mL/hr at 09/03/12 0047  . sodium chloride     PRN Meds:.acetaminophen, albuterol, ondansetron (ZOFRAN) IV, ondansetron Assessment/Plan: Principal Problem:   SIRS (systemic inflammatory response syndrome) Active Problems:   ARF (acute renal failure)Likely prerenal azotemia: Creatinine improved. Continue IV fluid.    UTI (urinary tract infection) cultures pending   Acute encephalopathy: Likely  related to infection, dehydration and possibly medication effect.   HCAP (healthcare-associated pneumonia) patient has a right-sided infiltrate and is coughing. She reports having been treated for pneumonia recently at [facility]. Unclear whether chest findings represent new infection versus resolution of previously treated pneumonia. Will change antibiotics to Levaquin and vancomycin until we can get records from outside facility  type 2 with renal complications   HTN (hypertension): Will resume Coreg. Hold ARB and clonidine for now.   Abnormal CPK: Could be mild rhabdomyolysis. Will repeat CPK, continue IV fluids and statin has been held   Other and unspecified hyperlipidemia Depression: Resume Lexapro Anxiety: Resume Clonopin at lower dose to avoid withdrawal. Get records from [facility] as soon as possible   LOS: 1  day   Suhail Peloquin L 09/03/2012, 1:05 PM

## 2012-09-04 DIAGNOSIS — E1129 Type 2 diabetes mellitus with other diabetic kidney complication: Secondary | ICD-10-CM

## 2012-09-04 DIAGNOSIS — E876 Hypokalemia: Secondary | ICD-10-CM | POA: Diagnosis not present

## 2012-09-04 LAB — URINE CULTURE
Colony Count: NO GROWTH
Culture: NO GROWTH

## 2012-09-04 LAB — BASIC METABOLIC PANEL
BUN: 29 mg/dL — ABNORMAL HIGH (ref 6–23)
CO2: 23 mEq/L (ref 19–32)
Chloride: 107 mEq/L (ref 96–112)
Chloride: 104 mEq/L (ref 96–112)
GFR calc Af Amer: 68 mL/min — ABNORMAL LOW (ref >90)
GFR calc non Af Amer: 58 mL/min — ABNORMAL LOW (ref >90)
Glucose, Bld: 171 mg/dL — ABNORMAL HIGH (ref 70–99)
Potassium: 3 mEq/L — ABNORMAL LOW (ref 3.5–5.1)
Potassium: 3.2 mEq/L — ABNORMAL LOW (ref 3.5–5.1)
Sodium: 138 mEq/L (ref 135–145)

## 2012-09-04 LAB — GLUCOSE, CAPILLARY
Glucose-Capillary: 163 mg/dL — ABNORMAL HIGH (ref 70–99)
Glucose-Capillary: 222 mg/dL — ABNORMAL HIGH (ref 70–99)
Glucose-Capillary: 180 mg/dL — ABNORMAL HIGH (ref 70–99)

## 2012-09-04 MED ORDER — ATORVASTATIN CALCIUM 40 MG PO TABS
40.0000 mg | ORAL_TABLET | Freq: Every day | ORAL | Status: DC
  Administered 2012-09-04 – 2012-09-06 (×3): 40 mg via ORAL
  Filled 2012-09-04 (×3): qty 1

## 2012-09-04 MED ORDER — LEVOFLOXACIN IN D5W 750 MG/150ML IV SOLN
750.0000 mg | INTRAVENOUS | Status: DC
  Filled 2012-09-04: qty 150

## 2012-09-04 MED ORDER — POTASSIUM CHLORIDE CRYS ER 20 MEQ PO TBCR
40.0000 meq | EXTENDED_RELEASE_TABLET | Freq: Two times a day (BID) | ORAL | Status: AC
  Administered 2012-09-04 – 2012-09-05 (×4): 40 meq via ORAL
  Filled 2012-09-04 (×4): qty 2

## 2012-09-04 MED ORDER — VANCOMYCIN HCL IN DEXTROSE 1-5 GM/200ML-% IV SOLN
1000.0000 mg | Freq: Two times a day (BID) | INTRAVENOUS | Status: DC
  Administered 2012-09-04 – 2012-09-05 (×2): 1000 mg via INTRAVENOUS
  Filled 2012-09-04 (×3): qty 200

## 2012-09-04 MED ORDER — INSULIN DETEMIR 100 UNIT/ML ~~LOC~~ SOLN
5.0000 [IU] | Freq: Every day | SUBCUTANEOUS | Status: DC
  Administered 2012-09-04 – 2012-09-05 (×2): 5 [IU] via SUBCUTANEOUS
  Filled 2012-09-04: qty 10

## 2012-09-04 MED ORDER — LEVOFLOXACIN 750 MG PO TABS
750.0000 mg | ORAL_TABLET | Freq: Every day | ORAL | Status: DC
  Administered 2012-09-04 – 2012-09-05 (×2): 750 mg via ORAL
  Filled 2012-09-04 (×2): qty 1

## 2012-09-04 MED ORDER — LOSARTAN POTASSIUM 50 MG PO TABS
100.0000 mg | ORAL_TABLET | Freq: Every day | ORAL | Status: DC
  Administered 2012-09-04 – 2012-09-06 (×3): 100 mg via ORAL
  Filled 2012-09-04 (×3): qty 2

## 2012-09-04 MED ORDER — CLONAZEPAM 0.5 MG PO TABS
0.5000 mg | ORAL_TABLET | Freq: Two times a day (BID) | ORAL | Status: DC
  Administered 2012-09-04 – 2012-09-06 (×4): 0.5 mg via ORAL
  Filled 2012-09-04 (×4): qty 1

## 2012-09-04 NOTE — Progress Notes (Signed)
ANTIBIOTIC CONSULT NOTE  Pharmacy Consult for Vancomycin and Levaquin Indication: UTI/SIRS/PNA  Allergies  Allergen Reactions  . Penicillins Anaphylaxis  . Nsaids Other (See Comments)    Throat swells.    Patient Measurements: Height: 5\' 7"  (170.2 cm) Weight: 251 lb (113.853 kg) IBW/kg (Calculated) : 61.6  Vital Signs: Temp: 99 F (37.2 C) (03/16 0715) Temp src: Oral (03/16 0715) BP: 171/84 mmHg (03/16 0715) Pulse Rate: 85 (03/16 0715) Intake/Output from previous day: 03/15 0701 - 03/16 0700 In: 4348.3 [P.O.:480; I.V.:3868.3] Out: -  Intake/Output from this shift: Total I/O In: 240 [P.O.:240] Out: -   Labs:  Recent Labs  09/02/12 1036  09/03/12 0511 09/04/12 0543 09/04/12 0757  WBC 8.8  --  6.1  --   --   HGB 12.7  --  12.0  --   --   PLT 159  --  154  --   --   CREATININE  --   < > 1.94* 1.07 1.06  < > = values in this interval not displayed. Estimated Creatinine Clearance: 79 ml/min (by C-G formula based on Cr of 1.06). No results found for this basename: VANCOTROUGH, VANCOPEAK, VANCORANDOM, GENTTROUGH, GENTPEAK, GENTRANDOM, TOBRATROUGH, TOBRAPEAK, TOBRARND, AMIKACINPEAK, AMIKACINTROU, AMIKACIN,  in the last 72 hours   Microbiology: Recent Results (from the past 720 hour(s))  URINE CULTURE     Status: None   Collection Time    09/02/12 10:41 AM      Result Value Range Status   Specimen Description URINE, CATHETERIZED   Final   Special Requests NONE   Final   Culture  Setup Time 09/03/2012 01:24   Final   Colony Count NO GROWTH   Final   Culture NO GROWTH   Final   Report Status 09/04/2012 FINAL   Final  CULTURE, BLOOD (ROUTINE X 2)     Status: None   Collection Time    09/02/12 12:57 PM      Result Value Range Status   Specimen Description Blood RIGHT ARM   Final   Special Requests BOTTLES DRAWN AEROBIC AND ANAEROBIC 8 CC EACH   Final   Culture NO GROWTH 2 DAYS   Final   Report Status PENDING   Incomplete  CULTURE, BLOOD (ROUTINE X 2)     Status:  None   Collection Time    09/02/12  1:10 PM      Result Value Range Status   Specimen Description Blood LEFT HAND   Final   Special Requests BOTTLES DRAWN AEROBIC ONLY 8 CC   Final   Culture NO GROWTH 2 DAYS   Final   Report Status PENDING   Incomplete    Medical History: Past Medical History  Diagnosis Date  . Fibromyalgia   . Hypertension   . Hyperlipidemia   . Pneumonia   . Diabetes mellitus without complication   . Depression     Medications:  Scheduled:  . amLODipine  10 mg Oral Daily  . carvedilol  25 mg Oral BID WC  . clonazePAM  1 mg Oral BID  . enoxaparin (LOVENOX) injection  55 mg Subcutaneous Q24H  . insulin aspart  0-9 Units Subcutaneous TID AC & HS  . levofloxacin (LEVAQUIN) IV  750 mg Intravenous Q24H  . nystatin  5 mL Oral QID  . sertraline  200 mg Oral Daily  . [COMPLETED] vancomycin  2,000 mg Intravenous Once   Followed by  . vancomycin  1,000 mg Intravenous Q12H  . [DISCONTINUED] ciprofloxacin  400  mg Intravenous Q24H  . [DISCONTINUED] ciprofloxacin  400 mg Intravenous Q12H  . [DISCONTINUED] enoxaparin (LOVENOX) injection  30 mg Subcutaneous Q24H  . [DISCONTINUED] insulin aspart  0-9 Units Subcutaneous Q4H  . [DISCONTINUED] levofloxacin (LEVAQUIN) IV  750 mg Intravenous Q48H  . [DISCONTINUED] vancomycin  1,500 mg Intravenous Q24H   Assessment: Okay for Protocol Estimated Creatinine Clearance: 79 ml/min (by C-G formula based on Cr of 1.06). Renal function improving.  Goal of Therapy:  Eradicate infection.  Plan:  Change Levaquin 750mg  IV every 24 hours. Change Vancomycin to 1000mg  IV every 12 hours. Trough @ [employer]. Follow up culture results  Mady Gemma 09/04/2012,10:59 AM

## 2012-09-04 NOTE — Progress Notes (Signed)
Subjective: Still with cough. Feels weak and dizzy with standing. No dyspnea.  Objective: Vital signs in last 24 hours: Filed Vitals:   09/03/12 1945 09/04/12 0302 09/04/12 0715 09/04/12 0815  BP: 170/72 145/78 171/84   Pulse: 96 89 85   Temp: 98.7 F (37.1 C) 99.4 F (37.4 C) 99 F (37.2 C)   TempSrc: Oral Oral Oral   Resp:  20 20   Height:      Weight:      SpO2: 93% 93% 96% 96%   Weight change:   Intake/Output Summary (Last 24 hours) at 09/04/12 1242 Last data filed at 09/04/12 0800  Gross per 24 hour  Intake 3011.67 ml  Output      0 ml  Net 3011.67 ml   General: Alert. Oriented and appropriate. Coughing frequently. Cooperative. HEENT: White patches on her tongue. Slightly dry mucous membranes. Lungs: Rhonchi bilaterally without wheezes or rales. Cardiovascular regular rate rhythm without murmurs gallops rubs Abdomen obese soft nontender Extremities no clubbing cyanosis or edema  Lab Results: Basic Metabolic Panel:  Recent Labs Lab 09/04/12 0543 09/04/12 0757  NA 141 138  K 3.0* 3.2*  CL 107 104  CO2 23 24  GLUCOSE 171* 163*  BUN 29* 27*  CREATININE 1.07 1.06  CALCIUM 8.8 8.7   Liver Function Tests:  Recent Labs Lab 09/02/12 1037  AST 32  ALT 54*  ALKPHOS 64  BILITOT 0.7  PROT 6.9  ALBUMIN 3.4*   No results found for this basename: LIPASE, AMYLASE,  in the last 168 hours No results found for this basename: AMMONIA,  in the last 168 hours CBC:  Recent Labs Lab 09/02/12 1036 09/03/12 0511  WBC 8.8 6.1  NEUTROABS 5.7  --   HGB 12.7 12.0  HCT 38.3 35.9*  MCV 93.6 94.0  PLT 159 154   Cardiac Enzymes:  Recent Labs Lab 09/02/12 1037 09/04/12 0543  CKTOTAL 445* 58  TROPONINI <0.30  --    BNP: No results found for this basename: PROBNP,  in the last 168 hours D-Dimer: No results found for this basename: DDIMER,  in the last 168 hours CBG:  Recent Labs Lab 09/03/12 0722 09/03/12 1144 09/03/12 1643 09/03/12 2211  09/04/12 0736 09/04/12 1116  GLUCAP 115* 162* 243* 142* 163* 222*   Hemoglobin A1C:  Recent Labs Lab 09/02/12 1036  HGBA1C 7.2*   Fasting Lipid Panel: No results found for this basename: CHOL, HDL, LDLCALC, TRIG, CHOLHDL, LDLDIRECT,  in the last 168 hours Thyroid Function Tests: No results found for this basename: TSH, T4TOTAL, FREET4, T3FREE, THYROIDAB,  in the last 168 hours Coagulation: No results found for this basename: LABPROT, INR,  in the last 168 hours Anemia Panel: No results found for this basename: VITAMINB12, FOLATE, FERRITIN, TIBC, IRON, RETICCTPCT,  in the last 168 hours Urine Drug Screen: Drugs of Abuse     Component Value Date/Time   LABOPIA NONE DETECTED 09/02/2012 1041   COCAINSCRNUR NONE DETECTED 09/02/2012 1041   LABBENZ POSITIVE* 09/02/2012 1041   AMPHETMU NONE DETECTED 09/02/2012 1041   THCU NONE DETECTED 09/02/2012 1041   LABBARB NONE DETECTED 09/02/2012 1041    Alcohol Level:  Recent Labs Lab 09/02/12 1036  ETH <11   Urinalysis:  Recent Labs Lab 09/02/12 1041  COLORURINE YELLOW  LABSPEC >1.030*  PHURINE 5.5  GLUCOSEU NEGATIVE  HGBUR LARGE*  BILIRUBINUR NEGATIVE  KETONESUR NEGATIVE  PROTEINUR 30*  UROBILINOGEN 0.2  NITRITE NEGATIVE  LEUKOCYTESUR TRACE*   Micro Results: Recent  Results (from the past 240 hour(s))  URINE CULTURE     Status: None   Collection Time    09/02/12 10:41 AM      Result Value Range Status   Specimen Description URINE, CATHETERIZED   Final   Special Requests NONE   Final   Culture  Setup Time 09/03/2012 01:24   Final   Colony Count NO GROWTH   Final   Culture NO GROWTH   Final   Report Status 09/04/2012 FINAL   Final  CULTURE, BLOOD (ROUTINE X 2)     Status: None   Collection Time    09/02/12 12:57 PM      Result Value Range Status   Specimen Description Blood RIGHT ARM   Final   Special Requests BOTTLES DRAWN AEROBIC AND ANAEROBIC 8 CC EACH   Final   Culture NO GROWTH 2 DAYS   Final   Report Status  PENDING   Incomplete  CULTURE, BLOOD (ROUTINE X 2)     Status: None   Collection Time    09/02/12  1:10 PM      Result Value Range Status   Specimen Description Blood LEFT HAND   Final   Special Requests BOTTLES DRAWN AEROBIC ONLY 8 CC   Final   Culture NO GROWTH 2 DAYS   Final   Report Status PENDING   Incomplete   Studies/Results: No results found. Scheduled Meds: . amLODipine  10 mg Oral Daily  . atorvastatin  40 mg Oral Daily  . carvedilol  25 mg Oral BID WC  . clonazePAM  0.5 mg Oral BID  . enoxaparin (LOVENOX) injection  55 mg Subcutaneous Q24H  . insulin aspart  0-9 Units Subcutaneous TID AC & HS  . levofloxacin  750 mg Oral Daily  . losartan  100 mg Oral Daily  . nystatin  5 mL Oral QID  . potassium chloride  40 mEq Oral BID  . sertraline  200 mg Oral Daily  . vancomycin  1,000 mg Intravenous Q12H   Continuous Infusions: . sodium chloride 1,000 mL (09/04/12 0700)   PRN Meds:.acetaminophen, albuterol, HYDROcodone-acetaminophen, ondansetron Assessment/Plan: Principal Problem:   SIRS (systemic inflammatory response syndrome) Active Problems:   ARF (acute renal failure) secondary to prerenal azotemia, resolved. Decrease IV to 50 cc an hour   UTI (urinary tract infection) with negative urine culture   Acute encephalopathy: Likely related to infection, dehydration and possibly medication effect resolved   HCAP (healthcare-associated pneumonia) patient has a right-sided infiltrate and is coughing. She reports having been treated for pneumonia recently at [facility]. Unclear whether chest findings represent new infection versus resolution of previously treated pneumonia. Will change antibiotics to Levaquin and vancomycin until we can get records from outside facility. Still no records, clinically has pneumonia. Will continue to treat. Continue vancomycin. Change levofloxacin to by mouth.  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 Continue sliding scale.  Add Levemir   HTN (hypertension): Blood pressure high. Coreg resumed yesterday. Will resume ARB.   Abnormal CPK: Normal. Resume statin.   Other and unspecified hyperlipidemia Depression: Resume Lexapro Anxiety: Slightly delayed today. We'll decrease Klonopin and further. Hypokalemia: Replete by mouth. Check magnesium. Get records from [facility]   Physical therapy consult.   LOS: 2 days   Jeanne Terrance L 09/04/2012, 12:42 PM

## 2012-09-05 DIAGNOSIS — R509 Fever, unspecified: Secondary | ICD-10-CM

## 2012-09-05 LAB — STREP PNEUMONIAE URINARY ANTIGEN: Strep Pneumo Urinary Antigen: NEGATIVE

## 2012-09-05 LAB — GLUCOSE, CAPILLARY
Glucose-Capillary: 143 mg/dL — ABNORMAL HIGH (ref 70–99)
Glucose-Capillary: 151 mg/dL — ABNORMAL HIGH (ref 70–99)

## 2012-09-05 LAB — LEGIONELLA ANTIGEN, URINE: Legionella Antigen, Urine: NEGATIVE

## 2012-09-05 MED ORDER — CLONIDINE HCL 0.2 MG PO TABS
0.3000 mg | ORAL_TABLET | Freq: Two times a day (BID) | ORAL | Status: DC
  Administered 2012-09-05 – 2012-09-06 (×3): 0.3 mg via ORAL
  Filled 2012-09-05 (×4): qty 1

## 2012-09-05 MED ORDER — GUAIFENESIN-CODEINE 100-10 MG/5ML PO SOLN
5.0000 mL | Freq: Four times a day (QID) | ORAL | Status: DC | PRN
  Administered 2012-09-05: 5 mL via ORAL
  Administered 2012-09-06: 10 mL via ORAL
  Filled 2012-09-05: qty 5
  Filled 2012-09-05: qty 10

## 2012-09-05 NOTE — Progress Notes (Signed)
     Subjective: This lady says she feels somewhat weak. She cannot specify how. She denies any significant cough or dyspnea. She think she might have had a fever. She is eating and drinking well. There is no nausea vomiting.           Physical Exam: Blood pressure 183/87, pulse 86, temperature 100.1 F (37.8 C), temperature source Oral, resp. rate 16, height 5\' 7"  (1.702 m), weight 113.853 kg (251 lb), SpO2 90.00%. She looks systemically well. She does not look toxic or septic. She has a low-grade fever. Heart sounds are present without murmurs or added sounds. Lung fields are clinically clear with maybe slightly reduced breath sounds on the right lower lobe. She is alert and orientated now.   Investigations:  Recent Results (from the past 240 hour(s))  URINE CULTURE     Status: None   Collection Time    09/02/12 10:41 AM      Result Value Range Status   Specimen Description URINE, CATHETERIZED   Final   Special Requests NONE   Final   Culture  Setup Time 09/03/2012 01:24   Final   Colony Count NO GROWTH   Final   Culture NO GROWTH   Final   Report Status 09/04/2012 FINAL   Final  CULTURE, BLOOD (ROUTINE X 2)     Status: None   Collection Time    09/02/12 12:57 PM      Result Value Range Status   Specimen Description Blood RIGHT ARM   Final   Special Requests BOTTLES DRAWN AEROBIC AND ANAEROBIC 8 CC EACH   Final   Culture NO GROWTH 3 DAYS   Final   Report Status PENDING   Incomplete  CULTURE, BLOOD (ROUTINE X 2)     Status: None   Collection Time    09/02/12  1:10 PM      Result Value Range Status   Specimen Description Blood LEFT HAND   Final   Special Requests BOTTLES DRAWN AEROBIC ONLY 8 CC   Final   Culture NO GROWTH 3 DAYS   Final   Report Status PENDING   Incomplete     Basic Metabolic Panel:  Recent Labs  45/40/98 0511 09/04/12 0500 09/04/12 0543 09/04/12 0757  NA 141  --  141 138  K 3.8  --  3.0* 3.2*  CL 105  --  107 104  CO2 21  --  23 24   GLUCOSE 131*  --  171* 163*  BUN 47*  --  29* 27*  CREATININE 1.94*  --  1.07 1.06  CALCIUM 9.0  --  8.8 8.7  MG  --  1.4*  --   --        CBC:  Recent Labs  09/03/12 0511  WBC 6.1  HGB 12.0  HCT 35.9*  MCV 94.0  PLT 154        Medications: I have reviewed the patient's current medications.  Impression: 1. SIRS, unclear etiology. No evidence of UTI. No evidence of pneumonia. Resolved. 2. Dehydration, resolved with rehydration. 3. Type 2 diabetes mellitus. 4. Uncontrolled hypertension. 5. Hypokalemia.      Plan: 1. Discontinue IV fluids and encourage oral fluids. 2. Ambulate. 3. Adjust antihypertensive medications, resume some home medications. 4. Possible discharge home tomorrow depending on lab work and blood pressure.     LOS: 3 days   Wilson Singer Pager [phone]  09/05/2012, 11:03 AM

## 2012-09-05 NOTE — Progress Notes (Signed)
UR Chart Review Completed  

## 2012-09-05 NOTE — Progress Notes (Signed)
Patient was complaining of coughing. Doctor was notified and new orders were given. Will continue to monitor patient.

## 2012-09-06 LAB — CBC
MCV: 91.9 fL (ref 78.0–100.0)
Platelets: 140 10*3/uL — ABNORMAL LOW (ref 150–400)
RBC: 3.71 MIL/uL — ABNORMAL LOW (ref 3.87–5.11)
WBC: 5.3 10*3/uL (ref 4.0–10.5)

## 2012-09-06 LAB — COMPREHENSIVE METABOLIC PANEL
ALT: 47 U/L — ABNORMAL HIGH (ref 0–35)
AST: 36 U/L (ref 0–37)
Alkaline Phosphatase: 55 U/L (ref 39–117)
CO2: 26 mEq/L (ref 19–32)
Chloride: 107 mEq/L (ref 96–112)
Creatinine, Ser: 0.99 mg/dL (ref 0.50–1.10)
GFR calc non Af Amer: 63 mL/min — ABNORMAL LOW (ref >90)
Potassium: 3.7 mEq/L (ref 3.5–5.1)
Total Bilirubin: 0.3 mg/dL (ref 0.3–1.2)

## 2012-09-06 LAB — LEGIONELLA ANTIGEN, URINE

## 2012-09-06 LAB — GLUCOSE, CAPILLARY: Glucose-Capillary: 210 mg/dL — ABNORMAL HIGH (ref 70–99)

## 2012-09-06 MED ORDER — INSULIN DETEMIR 100 UNIT/ML ~~LOC~~ SOLN
5.0000 [IU] | Freq: Every day | SUBCUTANEOUS | Status: DC
  Filled 2012-09-06: qty 0.05

## 2012-09-06 MED ORDER — NYSTATIN 100000 UNIT/ML MT SUSP: 5.0000 mL | Freq: Four times a day (QID) | OROMUCOSAL | Status: DC

## 2012-09-06 MED ORDER — LEVOFLOXACIN 750 MG PO TABS: 750.0000 mg | ORAL_TABLET | Freq: Every day | ORAL | Status: DC

## 2012-09-06 NOTE — Care Management Note (Signed)
    Page 1 of 1   09/06/2012     2:08:10 PM   CARE MANAGEMENT NOTE 09/06/2012  Patient:  Gail Preston, Gail Preston   Account Number:  [id number]  Date Initiated:  09/06/2012  Documentation initiated by:  Rosemary Holms  Subjective/Objective Assessment:   Pt admitted from home. PCP is [employer]. To DC home. No HH needs identified or anticipated     Action/Plan:   Anticipated DC Date:  09/06/2012   Anticipated DC Plan:  HOME/SELF CARE         Choice offered to / List presented to:             Status of service:  Completed, signed off Medicare Important Message given?  YES (If response is "NO", the following Medicare IM given date fields will be blank) Date Medicare IM given:  09/05/2012 Date Additional Medicare IM given:    Discharge Disposition:  HOME/SELF CARE  Per UR Regulation:    If discussed at Long Length of Stay Meetings, dates discussed:    Comments:  09/06/12 Rosemary Holms RN BSN CM

## 2012-09-06 NOTE — Discharge Summary (Signed)
Physician Discharge Summary  [name] ZOX:[id number] DOB: 11/07/1955 DOA: 09/02/2012  PCP: No primary provider on file.  Admit date: 09/02/2012 Discharge date: 09/06/2012  Time spent: Greater than 30 minutes  Recommendations for Outpatient Follow-up:  1. Followup with primary care physician in [place] in couple weeks. Repeat chest x-ray in 4-6 weeks.  Discharge Diagnoses:  1. Probable right lower lobe pneumonia, improving. 2. Systemic inflammatory response syndrome with hypotension, resolved. 3. Acute renal failure secondary to #2/dehydration, resolved. 4. Oral thrush, being treated. 5. Hypertension. 6. Type 2 diabetes mellitus.   Discharge Condition: Stable and improved.  Diet recommendation: Carbohydrate modified diet.  Filed Weights   09/02/12 1547  Weight: 113.853 kg (251 lb)    History of present illness:  This 57 year old lady presented to the hospital with symptoms of altered mental status. Please see initial history as outlined below:  HPI: Gail Preston is a 57 y.o. female  Past medical history of recently treated pneumonia at outside hospital last month. She has been complaining of according to family in the emergency room of generalized fatigue and chills. Patient was found this morning confused and calling for help. She was brought into the emergency room nothing was able to relate somewhat of a store. States that she took an extra Percocet the night before. Urine drug screen interestingly noted benzodiazepines but no Percocet. Chest x-ray noted pneumonia but also cardiomegaly. White count and other lab work was normal with the exception of her renal function which was markedly elevated with a BUN of 61 and a creatinine of 4.3. It is of note that we have no previous labs to compare to. Urine also noted a very large UTI. Patient was given Narcan in the emergency room which immediately led to more awake and alert and with clarity. Hospitalists were called for further  evaluation and admission. I advised checking blood cultures and skipping one dose of IV Cipro for UTI. She was transferred to the floor she looked to be much more somnolent. Once patient arrived to the floor, no family was present.  Hospital Course:  Patient was admitted to step down unit and treated aggressively with intravenous fluids and intravenous antibiotics with the presumption of pneumonia and possible UTI as a source of infection. Urine culture was negative. Blood cultures were negative. Chest x-ray showed possibility of right basilar airspace disease consistent with pneumonia. CT brain scan was also done in view of her altered mental status and this was negative. She made gradual and definite improvement over the next couple of days and her creatinine improved to normal levels. Initially her creatinine on admission was 4.3 with a BUN of 61. She is now stable for discharge. She would benefit from repeat chest x-ray in 4-6 weeks.  Procedures:  None.   Consultations:  None.  Discharge Exam: Filed Vitals:   09/05/12 0528 09/05/12 1400 09/05/12 2111 09/06/12 0433  BP: 183/87 122/75 154/83 162/79  Pulse: 86 95 67 75  Temp: 100.1 F (37.8 C) 98.6 F (37 C) 98.7 F (37.1 C) 99.1 F (37.3 C)  TempSrc: Oral Oral Oral Oral  Resp: 16 18 18 18   Height:      Weight:      SpO2: 90% 91% 91% 93%    General: She looks systemically well. She is obese. She is not toxic or septic. Cardiovascular: Heart sounds are present without murmurs or added sounds. Respiratory: Lung fields are entirely clear. She is alert and orientated.  Discharge Instructions  Discharge Orders  Future Orders Complete By Expires     Diet - low sodium heart healthy  As directed     Increase activity slowly  As directed         Medication List    TAKE these medications       amLODipine 10 MG tablet  Commonly known as:  NORVASC  Take 10 mg by mouth daily.     atorvastatin 40 MG tablet  Commonly known as:   LIPITOR  Take 40 mg by mouth daily.     benazepril-hydrochlorthiazide 20-25 MG per tablet  Commonly known as:  LOTENSIN HCT  Take 1 tablet by mouth 2 (two) times daily.     carvedilol 25 MG tablet  Commonly known as:  COREG  Take 25 mg by mouth 2 (two) times daily with a meal.     clonazePAM 2 MG tablet  Commonly known as:  KLONOPIN  Take 2 mg by mouth 2 (two) times daily.     cloNIDine 0.3 MG tablet  Commonly known as:  CATAPRES  Take 0.3 mg by mouth 2 (two) times daily.     levofloxacin 750 MG tablet  Commonly known as:  LEVAQUIN  Take 1 tablet (750 mg total) by mouth daily.     losartan 100 MG tablet  Commonly known as:  COZAAR  Take 100 mg by mouth daily.     metFORMIN 500 MG 24 hr tablet  Commonly known as:  GLUCOPHAGE-XR  Take 1,000 mg by mouth 2 (two) times daily.     nystatin 100000 UNIT/ML suspension  Commonly known as:  MYCOSTATIN  Take 5 mLs (500,000 Units total) by mouth 4 (four) times daily.     oxyCODONE-acetaminophen 10-325 MG per tablet  Commonly known as:  PERCOCET  Take 1 tablet by mouth 4 (four) times daily.     sertraline 100 MG tablet  Commonly known as:  ZOLOFT  Take 200 mg by mouth daily.          The results of significant diagnostics from this hospitalization (including imaging, microbiology, ancillary and laboratory) are listed below for reference.    Significant Diagnostic Studies: Ct Head Wo Contrast  09/02/2012  *RADIOLOGY REPORT*  Clinical Data: Altered mental status  CT HEAD WITHOUT CONTRAST  Technique:  Contiguous axial images were obtained from the base of the skull through the vertex without contrast.  Comparison: None.  Findings: Image quality degraded by motion.  Multiple images were repeated.  Ventricle size is normal.  Patchy hypodensity in the cerebral white matter bilaterally, consistent with chronic microvascular ischemia. 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  Negative for acute infarct.  Negative for hemorrhage or  mass lesion.  Calvarium is intact.  IMPRESSION: Chronic microvascular ischemia.  No acute abnormality.   Original Report Authenticated By: Janeece Riggers, M.D.    Dg Chest Portable 1 View  09/02/2012  *RADIOLOGY REPORT*  Clinical Data: Altered mental status.  PORTABLE CHEST - 1 VIEW  Comparison: None.  Findings: There is cardiomegaly without edema.  There is some airspace disease in the right lung base.  Left lung appears clear. No pneumothorax or pleural effusion.  IMPRESSION:  1.  Right basilar airspace disease could be due to atelectasis, aspiration or pneumonia. 2.  Cardiomegaly.   Original Report Authenticated By: Holley Dexter, M.D.     Microbiology: Recent Results (from the past 240 hour(s))  URINE CULTURE     Status: None   Collection Time    09/02/12 10:41 AM  Result Value Range Status   Specimen Description URINE, CATHETERIZED   Final   Special Requests NONE   Final   Culture  Setup Time 09/03/2012 01:24   Final   Colony Count NO GROWTH   Final   Culture NO GROWTH   Final   Report Status 09/04/2012 FINAL   Final  CULTURE, BLOOD (ROUTINE X 2)     Status: None   Collection Time    09/02/12 12:57 PM      Result Value Range Status   Specimen Description Blood RIGHT ARM   Final   Special Requests BOTTLES DRAWN AEROBIC AND ANAEROBIC 8 CC EACH   Final   Culture NO GROWTH 3 DAYS   Final   Report Status PENDING   Incomplete  CULTURE, BLOOD (ROUTINE X 2)     Status: None   Collection Time    09/02/12  1:10 PM      Result Value Range Status   Specimen Description Blood LEFT HAND   Final   Special Requests BOTTLES DRAWN AEROBIC ONLY 8 CC   Final   Culture NO GROWTH 3 DAYS   Final   Report Status PENDING   Incomplete     Labs: Basic Metabolic Panel:  Recent Labs Lab 09/02/12 1037 09/03/12 0511 09/04/12 0500 09/04/12 0543 09/04/12 0757 09/06/12 0520  NA 140 141  --  141 138 142  K 4.0 3.8  --  3.0* 3.2* 3.7  CL 104 105  --  107 104 107  CO2 21 21  --  23 24 26    GLUCOSE 188* 131*  --  171* 163* 147*  BUN 61* 47*  --  29* 27* 23  CREATININE 4.30* 1.94*  --  1.07 1.06 0.99  CALCIUM 8.7 9.0  --  8.8 8.7 9.0  MG  --   --  1.4*  --   --   --    Liver Function Tests:  Recent Labs Lab 09/02/12 1037 09/06/12 0520  AST 32 36  ALT 54* 47*  ALKPHOS 64 55  BILITOT 0.7 0.3  PROT 6.9 6.5  ALBUMIN 3.4* 2.8*     CBC:  Recent Labs Lab 09/02/12 1036 09/03/12 0511 09/06/12 0520  WBC 8.8 6.1 5.3  NEUTROABS 5.7  --   --   HGB 12.7 12.0 11.6*  HCT 38.3 35.9* 34.1*  MCV 93.6 94.0 91.9  PLT 159 154 140*   Cardiac Enzymes:  Recent Labs Lab 09/02/12 1037 09/04/12 0543  CKTOTAL 445* 58  TROPONINI <0.30  --   )  CBG:  Recent Labs Lab 09/05/12 0809 09/05/12 1145 09/05/12 1722 09/05/12 2114 09/06/12 0805  GLUCAP 143* 170* 151* 163* 146*       Signed:  GOSRANI,NIMISH C  [facility] 09/06/2012, 8:45 AM

## 2012-09-07 LAB — CULTURE, BLOOD (ROUTINE X 2): Culture: NO GROWTH

## 2013-04-25 ENCOUNTER — Encounter: Payer: Self-pay | Admitting: Family Medicine

## 2013-04-25 ENCOUNTER — Ambulatory Visit (INDEPENDENT_AMBULATORY_CARE_PROVIDER_SITE_OTHER): Payer: Medicare Other | Admitting: Family Medicine

## 2013-04-25 VITALS — BP 166/75 | HR 69 | Temp 99.5°F | Ht 66.0 in | Wt 248.8 lb

## 2013-04-25 DIAGNOSIS — E119 Type 2 diabetes mellitus without complications: Secondary | ICD-10-CM

## 2013-04-25 DIAGNOSIS — G894 Chronic pain syndrome: Secondary | ICD-10-CM

## 2013-04-25 DIAGNOSIS — F411 Generalized anxiety disorder: Secondary | ICD-10-CM

## 2013-04-25 DIAGNOSIS — E785 Hyperlipidemia, unspecified: Secondary | ICD-10-CM

## 2013-04-25 DIAGNOSIS — I10 Essential (primary) hypertension: Secondary | ICD-10-CM

## 2013-04-25 LAB — POCT CBC
Granulocyte percent: 64.6 %G (ref 37–80)
HCT, POC: 42.2 % (ref 37.7–47.9)
Hemoglobin: 14.5 g/dL (ref 12.2–16.2)
Lymph, poc: 2.2 (ref 0.6–3.4)
MCH, POC: 31.6 pg — AB (ref 27–31.2)
MCHC: 34.5 g/dL (ref 31.8–35.4)
MCV: 91.7 fL (ref 80–97)
MPV: 8.5 fL (ref 0–99.8)
POC Granulocyte: 4.4 (ref 2–6.9)
POC LYMPH PERCENT: 31.8 %L (ref 10–50)
Platelet Count, POC: 212 10*3/uL (ref 142–424)
RBC: 4.6 M/uL (ref 4.04–5.48)
RDW, POC: 14 %
WBC: 6.8 10*3/uL (ref 4.6–10.2)

## 2013-04-25 LAB — POCT GLYCOSYLATED HEMOGLOBIN (HGB A1C): Hemoglobin A1C: 6.3

## 2013-04-25 MED ORDER — METFORMIN HCL ER 500 MG PO TB24: 500.0000 mg | ORAL_TABLET | Freq: Two times a day (BID) | ORAL | Status: DC

## 2013-04-25 MED ORDER — AMLODIPINE BESYLATE 10 MG PO TABS: 10.0000 mg | ORAL_TABLET | Freq: Every day | ORAL | Status: DC

## 2013-04-25 MED ORDER — ATORVASTATIN CALCIUM 40 MG PO TABS: 40.0000 mg | ORAL_TABLET | Freq: Every day | ORAL | Status: DC

## 2013-04-25 MED ORDER — BENAZEPRIL-HYDROCHLOROTHIAZIDE 20-25 MG PO TABS: 1.0000 | ORAL_TABLET | Freq: Every day | ORAL | Status: DC

## 2013-04-25 MED ORDER — LOSARTAN POTASSIUM 100 MG PO TABS: 100.0000 mg | ORAL_TABLET | Freq: Every day | ORAL | Status: DC

## 2013-04-25 MED ORDER — CARVEDILOL 25 MG PO TABS: 25.0000 mg | ORAL_TABLET | Freq: Two times a day (BID) | ORAL | Status: DC

## 2013-04-25 MED ORDER — CLONIDINE HCL 0.3 MG PO TABS: 0.3000 mg | ORAL_TABLET | Freq: Two times a day (BID) | ORAL | Status: DC

## 2013-04-25 MED ORDER — INSULIN ASPART 100 UNIT/ML CARTRIDGE (PENFILL): 100.0000 [IU] | Freq: Three times a day (TID) | SUBCUTANEOUS | Status: DC

## 2013-04-25 NOTE — Patient Instructions (Signed)

## 2013-04-25 NOTE — Progress Notes (Signed)
  Subjective:    Patient ID: Gail Preston, female    DOB: 1956/04/14, 57 y.o.   MRN: [id number]  HPI This 57 y.o. female presents for evaluation of establishment visit.  She has hx of diabetes And she is taking Novolog pen 30 units in am and pm.  She has hx of hypertension, Hyperlipidemia, diabetes, and chronic back pain.  She also has hx of anxiety.  She has Been rx'd clonazepam 2mg  po in the past by her prior provider.  She has no acute complaints.   Review of Systems  No chest pain, SOB, HA, dizziness, vision change, N/V, diarrhea, constipation, dysuria, urinary urgency or frequency, myalgias, arthralgias or rash.     Objective:   Physical Exam  Vital signs noted  Well developed well nourished female.  HEENT - Head atraumatic Normocephalic                Eyes - PERRLA, Conjuctiva - clear Sclera- Clear EOMI                Ears - EAC's Wnl TM's Wnl Gross Hearing WNL                Nose - Nares patent                 Throat - oropharanx wnl Respiratory - Lungs CTA bilateral Cardiac - RRR S1 and S2 without murmur GI - Abdomen soft Nontender and bowel sounds active x 4 Extremities - No edema. Neuro - Grossly intact.      Assessment & Plan:  Diabetes - Plan: metFORMIN (GLUCOPHAGE-XR) 500 MG 24 hr tablet, insulin aspart (NOVOLOG PENFILL) 100 UNIT/ML SOCT cartridge, POCT CBC, POCT glycosylated hemoglobin (Hb A1C), CMP14+EGFR, Thyroid Panel With TSH  Essential hypertension, benign - Plan: amLODipine (NORVASC) 10 MG tablet, benazepril-hydrochlorthiazide (LOTENSIN HCT) 20-25 MG per tablet, carvedilol (COREG) 25 MG tablet, cloNIDine (CATAPRES) 0.3 MG tablet, losartan (COZAAR) 100 MG tablet, POCT CBC, Lipid panel, CMP14+EGFR, Thyroid Panel With TSH  Hyperlipidemia - Plan: atorvastatin (LIPITOR) 40 MG tablet, POCT CBC, CMP14+EGFR, Thyroid Panel With TSH  Chronic pain syndrome - Plan: Ambulatory referral to Pain Clinic  Anxiety - Given patient a list of psychiatric providers to call  and schedule appointment.    Follow up in 3 months  Deatra Canter FNP

## 2013-04-26 LAB — CMP14+EGFR
ALT: 66 IU/L — ABNORMAL HIGH (ref 0–32)
AST: 35 IU/L (ref 0–40)
Albumin/Globulin Ratio: 1.5 (ref 1.1–2.5)
Albumin: 4.6 g/dL (ref 3.5–5.5)
Alkaline Phosphatase: 76 IU/L (ref 39–117)
BUN/Creatinine Ratio: 17 (ref 9–23)
BUN: 15 mg/dL (ref 6–24)
CO2: 29 mmol/L (ref 18–29)
Calcium: 10 mg/dL (ref 8.7–10.2)
Chloride: 98 mmol/L (ref 97–108)
Creatinine, Ser: 0.9 mg/dL (ref 0.57–1.00)
GFR calc Af Amer: 83 mL/min/{1.73_m2} (ref >59)
GFR calc non Af Amer: 72 mL/min/{1.73_m2} (ref >59)
Globulin, Total: 3 g/dL (ref 1.5–4.5)
Glucose: 149 mg/dL — ABNORMAL HIGH (ref 65–99)
Potassium: 4.5 mmol/L (ref 3.5–5.2)
Sodium: 141 mmol/L (ref 134–144)
Total Bilirubin: 0.6 mg/dL (ref 0.0–1.2)
Total Protein: 7.6 g/dL (ref 6.0–8.5)

## 2013-04-26 LAB — THYROID PANEL WITH TSH
Free Thyroxine Index: 2.6 (ref 1.2–4.9)
T3 Uptake Ratio: 25 % (ref 24–39)
T4, Total: 10.3 ug/dL (ref 4.5–12.0)
TSH: 1.44 u[IU]/mL (ref 0.450–4.500)

## 2013-04-26 LAB — LIPID PANEL
Chol/HDL Ratio: 2.7 ratio units (ref 0.0–4.4)
Cholesterol, Total: 132 mg/dL (ref 100–199)
HDL: 49 mg/dL (ref >39)
LDL Calculated: 65 mg/dL (ref 0–99)
Triglycerides: 89 mg/dL (ref 0–149)
VLDL Cholesterol Cal: 18 mg/dL (ref 5–40)

## 2013-05-09 ENCOUNTER — Encounter: Payer: Self-pay | Admitting: *Deleted

## 2013-05-09 NOTE — Progress Notes (Signed)
Quick Note:  Copy of labs sent to patient ______ 

## 2013-07-26 ENCOUNTER — Ambulatory Visit: Payer: Medicare Other | Admitting: Family Medicine

## 2013-08-07 ENCOUNTER — Ambulatory Visit (INDEPENDENT_AMBULATORY_CARE_PROVIDER_SITE_OTHER): Payer: Medicare Other | Admitting: Family Medicine

## 2013-08-07 VITALS — BP 128/78 | HR 75 | Temp 97.3°F | Wt 253.4 lb

## 2013-08-07 DIAGNOSIS — F329 Major depressive disorder, single episode, unspecified: Secondary | ICD-10-CM

## 2013-08-07 DIAGNOSIS — F3289 Other specified depressive episodes: Secondary | ICD-10-CM

## 2013-08-07 DIAGNOSIS — I10 Essential (primary) hypertension: Secondary | ICD-10-CM

## 2013-08-07 DIAGNOSIS — M1990 Unspecified osteoarthritis, unspecified site: Secondary | ICD-10-CM

## 2013-08-07 DIAGNOSIS — G894 Chronic pain syndrome: Secondary | ICD-10-CM

## 2013-08-07 DIAGNOSIS — E119 Type 2 diabetes mellitus without complications: Secondary | ICD-10-CM

## 2013-08-07 DIAGNOSIS — E041 Nontoxic single thyroid nodule: Secondary | ICD-10-CM

## 2013-08-07 DIAGNOSIS — E785 Hyperlipidemia, unspecified: Secondary | ICD-10-CM

## 2013-08-07 DIAGNOSIS — M129 Arthropathy, unspecified: Secondary | ICD-10-CM

## 2013-08-07 DIAGNOSIS — F32A Depression, unspecified: Secondary | ICD-10-CM

## 2013-08-07 LAB — POCT CBC
Granulocyte percent: 63.1 %G (ref 37–80)
HCT, POC: 44.1 % (ref 37.7–47.9)
Hemoglobin: 14.4 g/dL (ref 12.2–16.2)
Lymph, poc: 2.5 (ref 0.6–3.4)
MCH, POC: 30.7 pg (ref 27–31.2)
MCHC: 32.6 g/dL (ref 31.8–35.4)
MCV: 94.2 fL (ref 80–97)
MPV: 8.9 fL (ref 0–99.8)
POC Granulocyte: 4.4 (ref 2–6.9)
POC LYMPH PERCENT: 35.1 %L (ref 10–50)
Platelet Count, POC: 157 10*3/uL (ref 142–424)
RBC: 4.7 M/uL (ref 4.04–5.48)
RDW, POC: 13.7 %
WBC: 7 10*3/uL (ref 4.6–10.2)

## 2013-08-07 LAB — POCT GLYCOSYLATED HEMOGLOBIN (HGB A1C): Hemoglobin A1C: 12.1

## 2013-08-07 LAB — GLUCOSE, POCT (MANUAL RESULT ENTRY): POC Glucose: 440 mg/dl — AB (ref 70–99)

## 2013-08-07 MED ORDER — ATORVASTATIN CALCIUM 40 MG PO TABS: 40.0000 mg | ORAL_TABLET | Freq: Every day | ORAL | Status: DC

## 2013-08-07 MED ORDER — AMLODIPINE BESYLATE 10 MG PO TABS: 10.0000 mg | ORAL_TABLET | Freq: Every day | ORAL | Status: DC

## 2013-08-07 MED ORDER — PREGABALIN 200 MG PO CAPS: 200.0000 mg | ORAL_CAPSULE | Freq: Three times a day (TID) | ORAL | Status: DC

## 2013-08-07 MED ORDER — INSULIN ASPART 100 UNIT/ML CARTRIDGE (PENFILL): 100.0000 [IU] | Freq: Three times a day (TID) | SUBCUTANEOUS | Status: DC

## 2013-08-07 MED ORDER — SERTRALINE HCL 50 MG PO TABS: 50.0000 mg | ORAL_TABLET | Freq: Every day | ORAL | Status: DC

## 2013-08-07 MED ORDER — CLONIDINE HCL 0.3 MG PO TABS: 0.3000 mg | ORAL_TABLET | Freq: Two times a day (BID) | ORAL | Status: DC

## 2013-08-07 MED ORDER — MELOXICAM 15 MG PO TABS: 15.0000 mg | ORAL_TABLET | Freq: Every day | ORAL | Status: DC

## 2013-08-07 MED ORDER — BENAZEPRIL-HYDROCHLOROTHIAZIDE 20-25 MG PO TABS: 1.0000 | ORAL_TABLET | Freq: Every day | ORAL | Status: DC

## 2013-08-07 MED ORDER — METFORMIN HCL ER 500 MG PO TB24: 500.0000 mg | ORAL_TABLET | Freq: Two times a day (BID) | ORAL | Status: DC

## 2013-08-07 MED ORDER — CARVEDILOL 25 MG PO TABS: 25.0000 mg | ORAL_TABLET | Freq: Two times a day (BID) | ORAL | Status: DC

## 2013-08-07 MED ORDER — LOSARTAN POTASSIUM 100 MG PO TABS: 100.0000 mg | ORAL_TABLET | Freq: Every day | ORAL | Status: DC

## 2013-08-07 NOTE — Addendum Note (Signed)
Addended by: Monica BectonHODGES, Jak Haggar F on: 08/07/2013 09:47 AM   Modules accepted: Orders

## 2013-08-07 NOTE — Patient Instructions (Signed)

## 2013-08-07 NOTE — Progress Notes (Signed)
   Subjective:    Patient ID: Gail Preston, female    DOB: [birth year], 58 y.o.   MRN: [id number]  HPI This 58 y.o. female presents for evaluation of needing refills.  She said she fell and had a ct of her neck at the ED at [facility] and she was told she had a thyroid nodule.  She has hx of hypertension. She states she has chronic pain and she was seeing dr. Cherylann Ratel for pain management.  She has hx of DDD of the LS spine.  She states she has panic attacks.  She states she quit taking her insulin. She needs refills on her meds.  She states she is eating a lot of BC powders otc for arthritis pain.    Review of Systems C/o anxiety, chronic pain, back pain   No chest pain, SOB, HA, dizziness, vision change, N/V, diarrhea, constipation, dysuria, urinary urgency or frequency, myalgias, arthralgias or rash.  Objective:   Physical Exam Vital signs noted  Well developed well nourished female.  HEENT - Head atraumatic Normocephalic                Eyes - PERRLA, Conjuctiva - clear Sclera- Clear EOMI                Ears - EAC's Wnl TM's Wnl Gross Hearing WNL                Nose - Nares patent                 Throat - oropharanx wnl Respiratory - Lungs CTA bilateral Cardiac - RRR S1 and S2 without murmur GI - Abdomen soft Nontender and bowel sounds active x 4 Extremities - No edema. Neuro - Grossly intact.    Assessment & Plan:  Essential hypertension, benign - Plan: amLODipine (NORVASC) 10 MG tablet, benazepril-hydrochlorthiazide (LOTENSIN HCT) 20-25 MG per tablet, carvedilol (COREG) 25 MG tablet, cloNIDine (CATAPRES) 0.3 MG tablet, losartan (COZAAR) 100 MG tablet  Hyperlipidemia - Plan: atorvastatin (LIPITOR) 40 MG tablet  Diabetes - Plan: insulin aspart (NOVOLOG PENFILL) cartridge, metFORMIN (GLUCOPHAGE-XR) 500 MG 24 hr tablet, POCT CBC, POCT glycosylated hemoglobin (Hb A1C), CMP14+EGFR. Advised her to follow up with Tammy Eckard Pharm D.  Explained the importance of being on  meds  Depression - Plan: sertraline (ZOLOFT) 50 MG tablet.  Discussed with her that I will not rx Her clonazepam.  Explained that she needs to get back on long acting SSRI's to control and tx Her anxiety and depression.  Chronic pain syndrome - Plan: pregabalin (LYRICA) 200 MG capsule.  Discussed referral to pain management and she refuses.  Refuse to rx her oxycodone and explain that I do not rx narcotic pain medicine and will rx alternatives to narcotics    Thyroid nodule - Plan: US Soft Tissue Head/Neck  Bo Rogue J Inetta Dicke FNP

## 2013-08-08 LAB — CMP14+EGFR
ALT: 87 IU/L — ABNORMAL HIGH (ref 0–32)
AST: 35 IU/L (ref 0–40)
Albumin/Globulin Ratio: 1.8 (ref 1.1–2.5)
Albumin: 4.4 g/dL (ref 3.5–5.5)
Alkaline Phosphatase: 116 IU/L (ref 39–117)
BUN/Creatinine Ratio: 18 (ref 9–23)
BUN: 21 mg/dL (ref 6–24)
CO2: 24 mmol/L (ref 18–29)
Calcium: 9.7 mg/dL (ref 8.7–10.2)
Chloride: 91 mmol/L — ABNORMAL LOW (ref 97–108)
Creatinine, Ser: 1.17 mg/dL — ABNORMAL HIGH (ref 0.57–1.00)
GFR calc Af Amer: 61 mL/min/{1.73_m2} (ref >59)
GFR calc non Af Amer: 53 mL/min/{1.73_m2} — ABNORMAL LOW (ref >59)
Globulin, Total: 2.5 g/dL (ref 1.5–4.5)
Glucose: 440 mg/dL (ref 65–99)
Potassium: 4.5 mmol/L (ref 3.5–5.2)
Sodium: 133 mmol/L — ABNORMAL LOW (ref 134–144)
Total Bilirubin: 0.5 mg/dL (ref 0.0–1.2)
Total Protein: 6.9 g/dL (ref 6.0–8.5)

## 2013-08-09 ENCOUNTER — Other Ambulatory Visit: Payer: Self-pay | Admitting: Family Medicine

## 2013-08-09 ENCOUNTER — Ambulatory Visit: Payer: Medicare Other | Admitting: Family Medicine

## 2013-08-09 ENCOUNTER — Telehealth: Payer: Self-pay | Admitting: Family Medicine

## 2013-08-09 MED ORDER — GABAPENTIN 300 MG PO CAPS: 300.0000 mg | ORAL_CAPSULE | Freq: Three times a day (TID) | ORAL | Status: DC

## 2013-08-10 ENCOUNTER — Ambulatory Visit (HOSPITAL_COMMUNITY): Payer: Medicare Other

## 2013-08-10 ENCOUNTER — Other Ambulatory Visit: Payer: Self-pay | Admitting: Family Medicine

## 2013-10-02 ENCOUNTER — Telehealth: Payer: Self-pay | Admitting: Family Medicine

## 2013-11-14 ENCOUNTER — Other Ambulatory Visit: Payer: Self-pay | Admitting: Family Medicine

## 2013-11-14 ENCOUNTER — Encounter: Payer: Self-pay | Admitting: Family Medicine

## 2013-11-14 ENCOUNTER — Ambulatory Visit (INDEPENDENT_AMBULATORY_CARE_PROVIDER_SITE_OTHER): Payer: Medicare HMO | Admitting: Family Medicine

## 2013-11-14 VITALS — BP 162/87 | HR 88 | Temp 97.2°F | Ht 66.0 in | Wt 241.8 lb

## 2013-11-14 DIAGNOSIS — E119 Type 2 diabetes mellitus without complications: Secondary | ICD-10-CM

## 2013-11-14 DIAGNOSIS — E785 Hyperlipidemia, unspecified: Secondary | ICD-10-CM

## 2013-11-14 DIAGNOSIS — I10 Essential (primary) hypertension: Secondary | ICD-10-CM

## 2013-11-14 LAB — POCT CBC
Granulocyte percent: 65.4 %G (ref 37–80)
HCT, POC: 45.5 % (ref 37.7–47.9)
Hemoglobin: 14.8 g/dL (ref 12.2–16.2)
Lymph, poc: 2 (ref 0.6–3.4)
MCH, POC: 30.3 pg (ref 27–31.2)
MCHC: 32.5 g/dL (ref 31.8–35.4)
MCV: 93.1 fL (ref 80–97)
MPV: 9.2 fL (ref 0–99.8)
POC Granulocyte: 4.2 (ref 2–6.9)
POC LYMPH PERCENT: 31.3 %L (ref 10–50)
Platelet Count, POC: 188 10*3/uL (ref 142–424)
RBC: 4.9 M/uL (ref 4.04–5.48)
RDW, POC: 13.5 %
WBC: 6.4 10*3/uL (ref 4.6–10.2)

## 2013-11-14 LAB — POCT GLYCOSYLATED HEMOGLOBIN (HGB A1C): Hemoglobin A1C: 12

## 2013-11-14 LAB — GLUCOSE, POCT (MANUAL RESULT ENTRY): POC Glucose: 334 mg/dl — AB (ref 70–99)

## 2013-11-14 MED ORDER — BENAZEPRIL-HYDROCHLOROTHIAZIDE 20-25 MG PO TABS: 1.0000 | ORAL_TABLET | Freq: Every day | ORAL | Status: DC

## 2013-11-14 MED ORDER — METFORMIN HCL ER 500 MG PO TB24: 500.0000 mg | ORAL_TABLET | Freq: Two times a day (BID) | ORAL | Status: DC

## 2013-11-14 MED ORDER — CARVEDILOL 25 MG PO TABS: 25.0000 mg | ORAL_TABLET | Freq: Two times a day (BID) | ORAL | Status: DC

## 2013-11-14 MED ORDER — AMLODIPINE BESYLATE 10 MG PO TABS: 10.0000 mg | ORAL_TABLET | Freq: Every day | ORAL | Status: DC

## 2013-11-14 MED ORDER — CLONIDINE HCL 0.3 MG PO TABS: 0.3000 mg | ORAL_TABLET | Freq: Two times a day (BID) | ORAL | Status: DC

## 2013-11-14 NOTE — Progress Notes (Signed)
   Subjective:    Patient ID: Gail Preston, female    DOB: [birth year], 58 y.o.   MRN: [id number]  HPI This 58 y.o. female presents for evaluation of routine follow up.  She has hx of diabetes, hypertension, and hyperlipidemia.  She has not been taking her lipitor because she cannot afford it.  She is trying to get medicaid insurance to be able to afford medicine.  She needs refills.   Review of Systems No chest pain, SOB, HA, dizziness, vision change, N/V, diarrhea, constipation, dysuria, urinary urgency or frequency, myalgias, arthralgias or rash.     Objective:   Physical Exam  Vital signs noted  Well developed well nourished obese female.  HEENT - Head atraumatic Normocephalic                Eyes - PERRLA, Conjuctiva - clear Sclera- Clear EOMI                Ears - EAC's Wnl TM's Wnl Gross Hearing WNL                Nose - Nares patent                 Throat - oropharanx wnl Respiratory - Lungs CTA bilateral Cardiac - RRR S1 and S2 without murmur GI - Abdomen soft Nontender and bowel sounds active x 4 Extremities - No edema. Neuro - Grossly intact.      Assessment & Plan:  Diabetes - Plan: POCT glycosylated hemoglobin (Hb A1C), CMP14+EGFR, metFORMIN (GLUCOPHAGE-XR) 500 MG 24 hr tablet, DISCONTINUED: metFORMIN (GLUCOPHAGE-XR) 500 MG 24 hr tablet  Essential hypertension, benign - Plan: POCT CBC, amLODipine (NORVASC) 10 MG tablet, benazepril-hydrochlorthiazide (LOTENSIN HCT) 20-25 MG per tablet, carvedilol (COREG) 25 MG tablet, cloNIDine (CATAPRES) 0.3 MG tablet, DISCONTINUED: cloNIDine (CATAPRES) 0.3 MG tablet, DISCONTINUED: carvedilol (COREG) 25 MG tablet, DISCONTINUED: amLODipine (NORVASC) 10 MG tablet  Hyperlipidemia - Recommend she take her atorvastatin  Lysbeth Penner FNP

## 2013-11-14 NOTE — Addendum Note (Signed)
Addended by: Monica Becton on: 11/14/2013 09:15 AM   Modules accepted: Orders

## 2013-11-15 LAB — CMP14+EGFR
ALT: 77 IU/L — ABNORMAL HIGH (ref 0–32)
AST: 40 IU/L (ref 0–40)
Albumin/Globulin Ratio: 2 (ref 1.1–2.5)
Albumin: 4.7 g/dL (ref 3.5–5.5)
Alkaline Phosphatase: 102 IU/L (ref 39–117)
BUN/Creatinine Ratio: 21 (ref 9–23)
BUN: 14 mg/dL (ref 6–24)
CO2: 21 mmol/L (ref 18–29)
Calcium: 9.8 mg/dL (ref 8.7–10.2)
Chloride: 98 mmol/L (ref 97–108)
Creatinine, Ser: 0.68 mg/dL (ref 0.57–1.00)
GFR calc Af Amer: 114 mL/min/{1.73_m2} (ref >59)
GFR calc non Af Amer: 99 mL/min/{1.73_m2} (ref >59)
Globulin, Total: 2.4 g/dL (ref 1.5–4.5)
Glucose: 301 mg/dL — ABNORMAL HIGH (ref 65–99)
Potassium: 4.6 mmol/L (ref 3.5–5.2)
Sodium: 136 mmol/L (ref 134–144)
Total Bilirubin: 0.4 mg/dL (ref 0.0–1.2)
Total Protein: 7.1 g/dL (ref 6.0–8.5)

## 2013-11-30 ENCOUNTER — Other Ambulatory Visit: Payer: Self-pay

## 2013-11-30 DIAGNOSIS — E119 Type 2 diabetes mellitus without complications: Secondary | ICD-10-CM

## 2013-11-30 MED ORDER — METFORMIN HCL ER 500 MG PO TB24: 500.0000 mg | ORAL_TABLET | Freq: Two times a day (BID) | ORAL | Status: DC

## 2013-12-05 ENCOUNTER — Ambulatory Visit (INDEPENDENT_AMBULATORY_CARE_PROVIDER_SITE_OTHER): Payer: Commercial Managed Care - HMO | Admitting: Pharmacist Clinician (PhC)/ Clinical Pharmacy Specialist

## 2013-12-05 ENCOUNTER — Encounter: Payer: Self-pay | Admitting: Pharmacist Clinician (PhC)/ Clinical Pharmacy Specialist

## 2013-12-05 DIAGNOSIS — E1165 Type 2 diabetes mellitus with hyperglycemia: Principal | ICD-10-CM

## 2013-12-05 DIAGNOSIS — IMO0001 Reserved for inherently not codable concepts without codable children: Secondary | ICD-10-CM

## 2013-12-05 DIAGNOSIS — E119 Type 2 diabetes mellitus without complications: Secondary | ICD-10-CM

## 2013-12-05 MED ORDER — INSULIN GLARGINE 300 UNIT/ML ~~LOC~~ SOPN: 20.0000 [IU] | PEN_INJECTOR | Freq: Every day | SUBCUTANEOUS | Status: DC

## 2013-12-05 NOTE — Progress Notes (Addendum)
Subjective:    Patient ID: Corinna LinesDonna Knisley, female    DOB: Sep 09, 1955, 58 y.o.   MRN: [id number]  HPI: History of diabetes, she is new to our practice had [place] [facility].  Moved to [place] to be with her son.    Review of Systems  Constitutional: Negative.   Endocrine: Negative.   Musculoskeletal: Positive for back pain, gait problem and myalgias.  Skin: Negative.   Neurological: Positive for dizziness.  Psychiatric/Behavioral: Positive for sleep disturbance. The patient is nervous/anxious.        Objective:   Physical Exam  Constitutional: She is oriented to person, place, and time. She appears well-developed and well-nourished.  Neurological: She is alert and oriented to person, place, and time.  Skin: Skin is warm and dry.  Psychiatric: Her behavior is normal. Judgment and thought content normal.          Assessment & Plan:   Diabetes Follow-Up Visit Chief Complaint:  No chief complaint on file.    Exam Regularity:  RRR Edema:  neg  Polyuria:  neg  Polydipsia:  neg Polyphagia:  neg  BMI:  There is no weight on file to calculate BMI.   Weight changes:  stable General Appearance:  alert, oriented, no acute distress and obese Mood/Affect:  anxious  HPI:  Diabetes new referral   Low fat/carbohydrate diet?  No Nicotine Abuse?  No Medication Compliance?  No Exercise?  No Alcohol Abuse?  No  Home BG Monitoring:  Checking 0 times a day. Average:  0  High: 0  Low:  0 patient out of test strips not checking BG   Lab Results  Component Value Date   HGBA1C 12.0% 11/14/2013    No results found for this basenameConcepcion Elk: MICROALBUR, [id number]    Lab Results  Component Value Date   HDL 49 04/25/2013   LDLCALC 65 04/25/2013   TRIG 89 04/25/2013   CHOLHDL 2.7 04/25/2013      Assessment: 1.  Diabetes.  Poor Control due to lack of income to purchase medications, poor living conditions and food choices.  No physical activity due to back pain. 2.  Blood  Pressure.  Elevated secondary to not being able to afford Benicar/HCT 3.  Lipids.  Need to be checked once she is able to get back on lipitor 4.  Foot Care.  Reviewed daily care with patient 5.  Dental Care.  Needs 1-2 times a year cleanings- can't afford 6.  Eye Care/Exam.  Annual needs to be scheduled  Recommendations: 1.  Patient is counseled on appropriate foot care. 2.  BP goal < 130/80. 3.  LDL goal of < 100, HDL > 40 and TG < 150. 4.  Eye Exam yearly and Dental Exam every 6 months. 5.  Dietary recommendations:  Reviewed CHO meal planning with patient and making smart choices with her diabetes.   6.  Physical Activity recommendations:  Chair exercises as tolerated 7.  Medication recommendations at this time are as follows:  Start Toujeo (insulin garlgine concentrate) 20 units at bedtime and increase metformin to 1gm bid, check serum creatinine in 2-4 weeks. Patient is out of Novolog completely and was taking 100 units tid.  She has been out of Novolog for at least a week or longer.  Instructed patient she would not re-start Novolog due to her inability to afford it and no samples in the office, her only insulin for now will be Toujeo. Patient should not take medications from any of  her neighbors.  She is counseled on signs and symptoms of hypoglycemia. meter from mail order. 8.  Return to clinic in 4-6 wks  Called patient on 6/17 morning to see what her BG readings are and she will have neighbor who is an ENT check BG and call me for directions on her medications.  Patient was instructed to pick up a meter at the office so she will be able to check her BG readings until her test strips arrive.   Time spent counseling patient:  1 hour  Physician time spent with patient:    Referring provider:  Purcell Nails[place]ford   PharmD:  Chari ManningBozovich, Tysheem Accardo, [facility] LPRPH

## 2013-12-06 ENCOUNTER — Telehealth: Payer: Self-pay | Admitting: Pharmacist Clinician (PhC)/ Clinical Pharmacy Specialist

## 2013-12-06 NOTE — Telephone Encounter (Signed)
Patient had her neighbor who is an EMT check her BG on his machine at it was 256mg /dL.  Patient did give herself Novolog (from a neighbor's supply) after I spoke with her earlier this morning and had instructed her not to take any novolog until we could monitor her BG readings and see the effects of Toujeo.  She said the reading of 256 was about an hour after taking Novolog.  I instructed her to eat since she had taken the Novolog.  Her EMT neighbor is going to pick up a glucometer at our office to last her till her strips come in.  I had offered her a meter yesterday, but she was anticipating her strips would be in that day from mail order.  Patient has very few resources and/or social support.  She does not have transportation, other than her neighbor (EMT).  She admits to a past history of substance abuse and a strong family history of substance abuse with one so in prison for heroin addiction and her spouse overdosed on oxycodone, valium, and xanax sever months ago per patient.  She does not want to be in a situation where she is tempted to abuse drugs again.  She is awaiting Medicaid due to a mix up in her last name from her divorce there has been a hold up in services.  She is living with a friend since her son kicked her out of his house.  This housing situation is not good since there are people coming in and out of her residence who she believes are using drugs.  [place] [facility] [place] and she seemed content with that situation.  Patient was given my home/cell number to call with BG concerns since she will require multiple changes in her diabetes medication regimen.

## 2013-12-06 NOTE — Telephone Encounter (Signed)
Called patient to see what her BG readings were this morning.  She will have neighbor who is an ENT check BG and call me back, her test strips have not arrived from mail order.  Patient instructed to pick up a meter at the office to use.  She is counseled on signs and symptoms of hypoglycemia and importance of eating breakfast (she had been skipping this meal).

## 2013-12-06 NOTE — Telephone Encounter (Signed)
Patient called to say she had picked up glucometer and checked her BG and it was around 220mg /dL.  [place] [facility] [place] and felt like it was low but doesn't know if it was so she ate a cookie and felt better.  I again counseled her on eating meals on a regular schedule and proper ways to treat hypoglycemia.  She is having some GI upset since increasing metformin (however she did take it on an empty stomach despite telling her not to)  So I instructed her to cut back for now to 500mg  bid until her GI issues resolve.  I again emphasized to patient that her new insulin is concentrated and long acting - And therefore she can not increase it like she did with Novolog without getting instruction from a provider in our office.  Patient was also told again not to take her neighbors Novolog.  She will check BG prior to meals and at bedtime and call with her readings.  Instructed to keep a food diary with BG results.

## 2013-12-08 ENCOUNTER — Telehealth: Payer: Self-pay | Admitting: Pharmacist

## 2013-12-08 NOTE — Telephone Encounter (Signed)
Patient was started on Toujeo insulin on Tuesday, June 16th.  I tried to call patient to follow up and make adjustments based on HBG readings if needed.  No answer but LV on VM for patient to call office.

## 2013-12-08 NOTE — Telephone Encounter (Signed)
Patient called Gail Preston back - per Marcelino DusterMichelle [name] BG have been better - between 153 and 210.   Michelle instructed her to increase Toujeo to 25 units daily.

## 2013-12-19 ENCOUNTER — Telehealth: Payer: Self-pay | Admitting: Family Medicine

## 2013-12-19 NOTE — Telephone Encounter (Signed)
Blood sugars in evening around 200 and mornings between 120-157mg /dL.  No low blood sugars.  Taking three metformins a day because of GI distress.  Will increase Toujeo to 28-30units of insulin a day.  Call in 1 week with readings.

## 2013-12-21 ENCOUNTER — Other Ambulatory Visit: Payer: Self-pay | Admitting: Family Medicine

## 2013-12-25 ENCOUNTER — Telehealth: Payer: Self-pay | Admitting: Family Medicine

## 2013-12-25 DIAGNOSIS — I10 Essential (primary) hypertension: Secondary | ICD-10-CM

## 2013-12-25 MED ORDER — CLONIDINE HCL 0.3 MG PO TABS: 0.3000 mg | ORAL_TABLET | Freq: Two times a day (BID) | ORAL | Status: DC

## 2013-12-25 NOTE — Telephone Encounter (Signed)
DONE

## 2014-01-03 ENCOUNTER — Telehealth: Payer: Self-pay | Admitting: Pharmacist

## 2014-01-03 NOTE — Telephone Encounter (Signed)
Patient is actually taking Toujeo qpm 35 units.   BG has been 170's to 200's mostly but she has had as high as 300.  Patient instructed to increase Toujeo to 40 units daily.  She can also use Novolog 5 units as needed for BG greater than 250.  She is to all for insulin adjustment if her needs to use Novolog more than 2 times per week.  Patient also talked for a long time about her anixety and wanting clonazepam like in the past.  I told her she would need to discuss with her PCP.  Sher requested appt with a female provider but not until August because she does not have funds to come in until August. Appt made for [employer]y Hawkes for 01/23/14

## 2014-01-08 ENCOUNTER — Telehealth: Payer: Self-pay | Admitting: Pharmacist

## 2014-01-08 MED ORDER — INSULIN GLARGINE 300 UNIT/ML ~~LOC~~ SOPN: 40.0000 [IU] | PEN_INJECTOR | Freq: Every day | SUBCUTANEOUS | Status: DC

## 2014-01-08 NOTE — Telephone Encounter (Signed)
Left #2 samples for Toujeo in refridge. Patient notified.

## 2014-01-20 ENCOUNTER — Other Ambulatory Visit: Payer: Self-pay | Admitting: *Deleted

## 2014-01-20 MED ORDER — METFORMIN HCL ER 500 MG PO TB24
500.0000 mg | ORAL_TABLET | Freq: Two times a day (BID) | ORAL | Status: DC
Start: 2014-01-20 — End: 2014-02-22

## 2014-01-20 MED ORDER — METFORMIN HCL ER 500 MG PO TB24: 500.0000 mg | ORAL_TABLET | Freq: Two times a day (BID) | ORAL | Status: DC

## 2014-01-22 ENCOUNTER — Other Ambulatory Visit: Payer: Self-pay | Admitting: *Deleted

## 2014-01-22 DIAGNOSIS — I10 Essential (primary) hypertension: Secondary | ICD-10-CM

## 2014-01-22 NOTE — Telephone Encounter (Signed)
Requesting mailorder prescription during Saturday clinic. [facility] [place] and [facility].

## 2014-01-24 ENCOUNTER — Ambulatory Visit: Payer: Self-pay | Admitting: Family

## 2014-02-05 ENCOUNTER — Telehealth: Payer: Self-pay | Admitting: Family Medicine

## 2014-02-05 MED ORDER — INSULIN GLARGINE 300 UNIT/ML ~~LOC~~ SOPN: 40.0000 [IU] | PEN_INJECTOR | Freq: Every day | SUBCUTANEOUS | Status: DC

## 2014-02-05 NOTE — Telephone Encounter (Signed)
Patient aware samples up front  

## 2014-02-22 ENCOUNTER — Ambulatory Visit (INDEPENDENT_AMBULATORY_CARE_PROVIDER_SITE_OTHER): Payer: Commercial Managed Care - HMO | Admitting: Family

## 2014-02-22 ENCOUNTER — Encounter: Payer: Self-pay | Admitting: Family

## 2014-02-22 ENCOUNTER — Encounter (INDEPENDENT_AMBULATORY_CARE_PROVIDER_SITE_OTHER): Payer: Self-pay

## 2014-02-22 VITALS — BP 168/93 | HR 84 | Temp 98.8°F | Ht 66.0 in | Wt 252.6 lb

## 2014-02-22 DIAGNOSIS — Z862 Personal history of diseases of the blood and blood-forming organs and certain disorders involving the immune mechanism: Secondary | ICD-10-CM

## 2014-02-22 DIAGNOSIS — F419 Anxiety disorder, unspecified: Secondary | ICD-10-CM

## 2014-02-22 DIAGNOSIS — F329 Major depressive disorder, single episode, unspecified: Secondary | ICD-10-CM

## 2014-02-22 DIAGNOSIS — E785 Hyperlipidemia, unspecified: Secondary | ICD-10-CM

## 2014-02-22 DIAGNOSIS — Z1321 Encounter for screening for nutritional disorder: Secondary | ICD-10-CM

## 2014-02-22 DIAGNOSIS — F411 Generalized anxiety disorder: Secondary | ICD-10-CM

## 2014-02-22 DIAGNOSIS — I10 Essential (primary) hypertension: Secondary | ICD-10-CM

## 2014-02-22 DIAGNOSIS — Z23 Encounter for immunization: Secondary | ICD-10-CM

## 2014-02-22 DIAGNOSIS — Z8639 Personal history of other endocrine, nutritional and metabolic disease: Secondary | ICD-10-CM

## 2014-02-22 DIAGNOSIS — F3289 Other specified depressive episodes: Secondary | ICD-10-CM

## 2014-02-22 DIAGNOSIS — F32A Depression, unspecified: Secondary | ICD-10-CM

## 2014-02-22 DIAGNOSIS — E118 Type 2 diabetes mellitus with unspecified complications: Secondary | ICD-10-CM

## 2014-02-22 LAB — POCT UA - MICROALBUMIN: Microalbumin Ur, POC: 100 mg/L

## 2014-02-22 LAB — POCT GLYCOSYLATED HEMOGLOBIN (HGB A1C): Hemoglobin A1C: 7.8

## 2014-02-22 MED ORDER — SERTRALINE HCL 50 MG PO TABS: 50.0000 mg | ORAL_TABLET | Freq: Every day | ORAL | Status: DC

## 2014-02-22 MED ORDER — ATORVASTATIN CALCIUM 40 MG PO TABS: 40.0000 mg | ORAL_TABLET | Freq: Every day | ORAL | Status: DC

## 2014-02-22 MED ORDER — METFORMIN HCL ER 500 MG PO TB24: 500.0000 mg | ORAL_TABLET | Freq: Two times a day (BID) | ORAL | Status: DC

## 2014-02-22 MED ORDER — BENAZEPRIL-HYDROCHLOROTHIAZIDE 20-25 MG PO TABS: 1.0000 | ORAL_TABLET | Freq: Every day | ORAL | Status: DC

## 2014-02-22 MED ORDER — CARVEDILOL 25 MG PO TABS: 25.0000 mg | ORAL_TABLET | Freq: Two times a day (BID) | ORAL | Status: DC

## 2014-02-22 MED ORDER — GABAPENTIN 300 MG PO CAPS: 300.0000 mg | ORAL_CAPSULE | Freq: Three times a day (TID) | ORAL | Status: DC

## 2014-02-22 MED ORDER — LOSARTAN POTASSIUM 100 MG PO TABS: 100.0000 mg | ORAL_TABLET | Freq: Every day | ORAL | Status: DC

## 2014-02-22 MED ORDER — CLONAZEPAM 2 MG PO TABS: 2.0000 mg | ORAL_TABLET | Freq: Three times a day (TID) | ORAL | Status: DC | PRN

## 2014-02-22 MED ORDER — INSULIN GLARGINE 300 UNIT/ML ~~LOC~~ SOPN: 40.0000 [IU] | PEN_INJECTOR | Freq: Every day | SUBCUTANEOUS | Status: DC

## 2014-02-22 MED ORDER — CLONIDINE HCL 0.3 MG PO TABS: 0.3000 mg | ORAL_TABLET | Freq: Two times a day (BID) | ORAL | Status: DC

## 2014-02-22 MED ORDER — INSULIN ASPART 100 UNIT/ML ~~LOC~~ SOLN: SUBCUTANEOUS | Status: DC

## 2014-02-22 MED ORDER — AMLODIPINE BESYLATE 10 MG PO TABS: 10.0000 mg | ORAL_TABLET | Freq: Every day | ORAL | Status: DC

## 2014-02-22 NOTE — Addendum Note (Signed)
Addended by: Almeta Monas on: 02/22/2014 03:34 PM   Modules accepted: Orders

## 2014-02-22 NOTE — Patient Instructions (Signed)

## 2014-02-22 NOTE — Progress Notes (Signed)
Subjective:    Patient ID: Gail Preston, female    DOB: February 26, 1956, 58 y.o.   MRN: [id number]  Hypertension This is a chronic problem. The current episode started more than 1 year ago. The problem has been waxing and waning since onset. The problem is uncontrolled. Associated symptoms include anxiety, headaches, palpitations and shortness of breath ("At times"). Pertinent negatives include no blurred vision or peripheral edema. Risk factors for coronary artery disease include diabetes mellitus, obesity, post-menopausal state and dyslipidemia. Past treatments include calcium channel blockers, beta blockers and direct vasodilators. The current treatment provides mild improvement. Hypertensive end-organ damage includes kidney disease and CVA. There is no history of CAD/MI, heart failure or a thyroid problem. There is no history of sleep apnea.  Diabetes She presents for her follow-up diabetic visit. She has type 2 diabetes mellitus. Hypoglycemia symptoms include headaches and nervousness/anxiousness. Pertinent negatives for hypoglycemia include no confusion. Associated symptoms include foot paresthesias and visual change. Pertinent negatives for diabetes include no blurred vision and no foot ulcerations. Pertinent negatives for hypoglycemia complications include no blackouts and no hospitalization. Symptoms are worsening. Diabetic complications include a CVA, nephropathy and peripheral neuropathy. Pertinent negatives for diabetic complications include no heart disease. Risk factors for coronary artery disease include diabetes mellitus, dyslipidemia, family history, hypertension, obesity, post-menopausal and tobacco exposure. Current diabetic treatment includes insulin injections and oral agent (monotherapy). She is compliant with treatment most of the time. She is following a diabetic diet. Her breakfast blood glucose range is generally 110-130 mg/dl. An ACE inhibitor/angiotensin II receptor blocker is being  taken. Eye exam is not current.  Hyperlipidemia This is a chronic problem. The current episode started more than 1 year ago. The problem is controlled. Recent lipid tests were reviewed and are normal. Exacerbating diseases include diabetes. Factors aggravating her hyperlipidemia include fatty foods and smoking. Associated symptoms include shortness of breath ("At times"). Pertinent negatives include no leg pain or myalgias. Current antihyperlipidemic treatment includes statins. The current treatment provides moderate improvement of lipids. Risk factors for coronary artery disease include diabetes mellitus, dyslipidemia, family history, hypertension, a sedentary lifestyle and post-menopausal.  Anxiety Presents for follow-up visit. Symptoms include excessive worry, irritability, nervous/anxious behavior, palpitations, panic and shortness of breath ("At times"). Patient reports no confusion or depressed mood. Symptoms occur most days.   Past treatments include SSRIs and benzodiazephines. The treatment provided mild relief. Compliance with prior treatments has been good.   *Pt states she has only been taking her amlodipine, clonidine, and coreg blood pressure medications. She has not been able to afford her losartan or benazepril-hydrochlorthiaze. Pt states [facility] did not have the scripts and she has had to get them from [facility] and could only afford those she is taking.    Review of Systems  Constitutional: Positive for irritability.  Eyes: Negative.  Negative for blurred vision.  Respiratory: Positive for shortness of breath ("At times").   Cardiovascular: Positive for palpitations.  Gastrointestinal: Negative.   Endocrine: Negative.   Genitourinary: Negative.   Musculoskeletal: Negative.  Negative for myalgias.  Neurological: Positive for headaches.  Hematological: Negative.   Psychiatric/Behavioral: Negative for confusion. The patient is nervous/anxious.   All other systems reviewed and are  negative.      Objective:   Physical Exam  Vitals reviewed. Constitutional: She is oriented to person, place, and time. She appears well-developed and well-nourished. No distress.  HENT:  Head: Normocephalic and atraumatic.  Right Ear: External ear normal.  Left Ear: External ear normal.  Nose:  Nose normal.  Mouth/Throat: Oropharynx is clear and moist.  Eyes: Pupils are equal, round, and reactive to light.  Neck: Normal range of motion. Neck supple. No thyromegaly present.  Cardiovascular: Normal rate, regular rhythm, normal heart sounds and intact distal pulses.   No murmur heard. Pulmonary/Chest: Effort normal and breath sounds normal. No respiratory distress. She has no wheezes.  Abdominal: Soft. Bowel sounds are normal. She exhibits no distension. There is no tenderness.  Musculoskeletal: Normal range of motion. She exhibits no edema and no tenderness.  Neurological: She is alert and oriented to person, place, and time. She has normal reflexes. No cranial nerve deficit.  Skin: Skin is warm and dry.  Psychiatric: She has a normal mood and affect. Her behavior is normal. Judgment and thought content normal.     BP 168/93  Pulse 84  Temp(Src) 98.8 F (37.1 C) (Oral)  Ht 5' 6"  (1.676 m)  Wt 252 lb 9.6 oz (114.579 kg)  BMI 40.79 kg/m2      Assessment & Plan:  1. Essential hypertension  2. DM (diabetes mellitus), type 2 with complications - POCT glycosylated hemoglobin (Hb A1C) - CMP14+EGFR - gabapentin (NEURONTIN) 300 MG capsule; Take 1 capsule (300 mg total) by mouth 3 (three) times daily.  Dispense: 90 capsule; Refill: 5 - insulin aspart (NOVOLOG) 100 UNIT/ML injection; Inject 5 unit as needed for BG great than 250.  Call office for insulin adjustment is needed more than 2 times per week  Dispense: 3 vial; Refill: 1 - Insulin Glargine (TOUJEO SOLOSTAR) 300 UNIT/ML SOPN; Inject 40 Units into the skin at bedtime.  Dispense: 2 pen; Refill: 2 - metFORMIN (GLUCOPHAGE-XR) 500  MG 24 hr tablet; Take 1 tablet (500 mg total) by mouth 2 (two) times daily.  Dispense: 180 tablet; Refill: 4 - POCT UA - Microalbumin  3. Anxiety - CMP14+EGFR - clonazePAM (KLONOPIN) 2 MG tablet; Take 1 tablet (2 mg total) by mouth 3 (three) times daily as needed.  Dispense: 60 tablet; Refill: 1  4. Depression - CMP14+EGFR - sertraline (ZOLOFT) 50 MG tablet; Take 1 tablet (50 mg total) by mouth daily.  Dispense: 90 tablet; Refill: 4 - clonazePAM (KLONOPIN) 2 MG tablet; Take 1 tablet (2 mg total) by mouth 3 (three) times daily as needed.  Dispense: 60 tablet; Refill: 1  5. Other and unspecified hyperlipidemia - CMP14+EGFR - Lipid panel  6. Essential hypertension, benign - CMP14+EGFR - amLODipine (NORVASC) 10 MG tablet; Take 1 tablet (10 mg total) by mouth daily.  Dispense: 90 tablet; Refill: 4 - benazepril-hydrochlorthiazide (LOTENSIN HCT) 20-25 MG per tablet; Take 1 tablet by mouth daily.  Dispense: 90 tablet; Refill: 4 - carvedilol (COREG) 25 MG tablet; Take 1 tablet (25 mg total) by mouth 2 (two) times daily with a meal.  Dispense: 180 tablet; Refill: 4 - cloNIDine (CATAPRES) 0.3 MG tablet; Take 1 tablet (0.3 mg total) by mouth 2 (two) times daily.  Dispense: 180 tablet; Refill: 4 - losartan (COZAAR) 100 MG tablet; Take 1 tablet (100 mg total) by mouth daily.  Dispense: 90 tablet; Refill: 3  7. Hyperlipidemia - atorvastatin (LIPITOR) 40 MG tablet; Take 1 tablet (40 mg total) by mouth daily.  Dispense: 90 tablet; Refill: 4  8. Encounter for vitamin deficiency screening - Vit D  25 hydroxy (rtn osteoporosis monitoring)  9. Hx of thyroid nodule - Thyroid Panel With TSH   Continue all meds Labs pending Health Maintenance reviewed-hemoccult cards given to patient with directions Diet and exercise encouraged RTO 2-3  weeks (Pt to get medications from mail order and restart ALL of her blood pressure medications and then follow-up with PCP)  Evelina Dun, FNP

## 2014-02-22 NOTE — Addendum Note (Signed)
Addended by: Orma Render F on: 02/22/2014 03:45 PM   Modules accepted: Orders

## 2014-02-23 LAB — CMP14+EGFR
A/G RATIO: 1.7 (ref 1.1–2.5)
ALK PHOS: 73 IU/L (ref 39–117)
ALT: 75 IU/L — ABNORMAL HIGH (ref 0–32)
AST: 39 IU/L (ref 0–40)
Albumin: 4.8 g/dL (ref 3.5–5.5)
BUN / CREAT RATIO: 27 — AB (ref 9–23)
BUN: 26 mg/dL — ABNORMAL HIGH (ref 6–24)
CO2: 25 mmol/L (ref 18–29)
CREATININE: 0.95 mg/dL (ref 0.57–1.00)
Calcium: 10.3 mg/dL — ABNORMAL HIGH (ref 8.7–10.2)
Chloride: 100 mmol/L (ref 97–108)
GFR calc Af Amer: 77 mL/min/{1.73_m2} (ref >59)
GFR, EST NON AFRICAN AMERICAN: 67 mL/min/{1.73_m2} (ref >59)
GLOBULIN, TOTAL: 2.9 g/dL (ref 1.5–4.5)
Glucose: 121 mg/dL — ABNORMAL HIGH (ref 65–99)
POTASSIUM: 4.4 mmol/L (ref 3.5–5.2)
SODIUM: 142 mmol/L (ref 134–144)
Total Bilirubin: 0.5 mg/dL (ref 0.0–1.2)
Total Protein: 7.7 g/dL (ref 6.0–8.5)

## 2014-02-23 LAB — THYROID PANEL WITH TSH
Free Thyroxine Index: 1.8 (ref 1.2–4.9)
T3 UPTAKE RATIO: 22 % — AB (ref 24–39)
T4 TOTAL: 8 ug/dL (ref 4.5–12.0)
TSH: 1.85 u[IU]/mL (ref 0.450–4.500)

## 2014-02-23 LAB — LIPID PANEL
CHOL/HDL RATIO: 4.3 ratio (ref 0.0–4.4)
Cholesterol, Total: 225 mg/dL — ABNORMAL HIGH (ref 100–199)
HDL: 52 mg/dL (ref >39)
LDL Calculated: 145 mg/dL — ABNORMAL HIGH (ref 0–99)
Triglycerides: 139 mg/dL (ref 0–149)
VLDL Cholesterol Cal: 28 mg/dL (ref 5–40)

## 2014-02-23 LAB — MICROALBUMIN, URINE: Microalbumin, Urine: 96.1 ug/mL — ABNORMAL HIGH (ref 0.0–17.0)

## 2014-02-23 LAB — VITAMIN D 25 HYDROXY (VIT D DEFICIENCY, FRACTURES): VIT D 25 HYDROXY: 28.8 ng/mL — AB (ref 30.0–100.0)

## 2014-02-27 ENCOUNTER — Other Ambulatory Visit: Payer: Self-pay | Admitting: *Deleted

## 2014-02-27 ENCOUNTER — Other Ambulatory Visit: Payer: Self-pay | Admitting: Family

## 2014-02-27 MED ORDER — ACCU-CHEK SOFTCLIX LANCETS MISC: Status: DC

## 2014-02-27 MED ORDER — ACCU-CHEK AVIVA PLUS W/DEVICE KIT: PACK | Status: DC

## 2014-02-27 MED ORDER — BD SWAB SINGLE USE REGULAR PADS: MEDICATED_PAD | Status: DC

## 2014-02-27 MED ORDER — GLUCOSE BLOOD VI STRP: ORAL_STRIP | Status: DC

## 2014-02-27 MED ORDER — ATORVASTATIN CALCIUM 80 MG PO TABS: 80.0000 mg | ORAL_TABLET | Freq: Every day | ORAL | Status: DC

## 2014-02-27 MED ORDER — VITAMIN D (ERGOCALCIFEROL) 1.25 MG (50000 UNIT) PO CAPS: 50000.0000 [IU] | ORAL_CAPSULE | ORAL | Status: DC

## 2014-02-27 NOTE — Progress Notes (Signed)
Tried sent RX to [facility], but would not send electronically. Pt called and per pt request to send to [facility] pharmacy mail delivery.

## 2014-02-27 NOTE — Telephone Encounter (Signed)
GINA R. VERIFIED WITH PT AND SHE DOES WANT [facility] FOR THESE RX.

## 2014-02-28 ENCOUNTER — Telehealth: Payer: Self-pay | Admitting: Family Medicine

## 2014-02-28 NOTE — Telephone Encounter (Signed)
Message copied by Azalee Course on Wed Feb 28, 2014 11:16 AM ------      Message from: Lendon Colonel, [place]      Created: Tue Feb 27, 2014  3:19 PM       HgBA1C elevated- Low carb diet      Microablumin in urine-positve- Pt needs to have good control of blood sugars and blood pressure      Kidney and liver function stable- Liver enzyme elevated- Pt needs to limit tylenol and alcohol intake      Cholesterol level elevated- Rx sent to pharmacy      Thyroid levels WNL      Vit D levels low- Rx sent to pharmacy             ------

## 2014-03-02 ENCOUNTER — Other Ambulatory Visit: Payer: Self-pay | Admitting: Family

## 2014-03-05 ENCOUNTER — Other Ambulatory Visit: Payer: Self-pay | Admitting: Family

## 2014-03-08 ENCOUNTER — Telehealth: Payer: Self-pay | Admitting: Family Medicine

## 2014-03-08 NOTE — Telephone Encounter (Signed)
Patient called must just be a wrong number

## 2014-03-09 ENCOUNTER — Telehealth: Payer: Self-pay | Admitting: Family

## 2014-03-12 NOTE — Telephone Encounter (Signed)
Do not fill Losartan dose. Med deleted from pt's medication list. Pt had been on medication in past and said she needed a refill. However, Losartan was d/c and pt put on benazepril-HCTZ instead.

## 2014-03-13 ENCOUNTER — Telehealth: Payer: Self-pay | Admitting: Family

## 2014-03-13 MED ORDER — "INSULIN SYRINGE 30G X 5/16"" 0.5 ML MISC": 1.0000 | Freq: Two times a day (BID) | Status: DC | PRN

## 2014-03-13 NOTE — Telephone Encounter (Signed)
rx sent in and pt aware. 

## 2014-03-13 NOTE — Telephone Encounter (Signed)
[facility] aware

## 2014-03-14 ENCOUNTER — Telehealth: Payer: Self-pay | Admitting: Family

## 2014-03-16 MED ORDER — "INSULIN SYRINGE 30G X 5/16"" 0.5 ML MISC": 1.0000 | Freq: Two times a day (BID) | Status: DC | PRN

## 2014-03-16 NOTE — Telephone Encounter (Signed)
done

## 2014-03-20 ENCOUNTER — Ambulatory Visit: Payer: Medicare Other | Admitting: Family Medicine

## 2014-03-23 ENCOUNTER — Ambulatory Visit (INDEPENDENT_AMBULATORY_CARE_PROVIDER_SITE_OTHER): Payer: Commercial Managed Care - HMO | Admitting: Family

## 2014-03-23 ENCOUNTER — Encounter: Payer: Self-pay | Admitting: Family

## 2014-03-23 VITALS — BP 138/78 | HR 80 | Temp 98.3°F | Ht 66.0 in | Wt 269.6 lb

## 2014-03-23 DIAGNOSIS — Z23 Encounter for immunization: Secondary | ICD-10-CM

## 2014-03-23 DIAGNOSIS — M549 Dorsalgia, unspecified: Secondary | ICD-10-CM | POA: Insufficient documentation

## 2014-03-23 DIAGNOSIS — F329 Major depressive disorder, single episode, unspecified: Secondary | ICD-10-CM

## 2014-03-23 DIAGNOSIS — I10 Essential (primary) hypertension: Secondary | ICD-10-CM

## 2014-03-23 DIAGNOSIS — M5442 Lumbago with sciatica, left side: Secondary | ICD-10-CM

## 2014-03-23 DIAGNOSIS — F32A Depression, unspecified: Secondary | ICD-10-CM

## 2014-03-23 DIAGNOSIS — F419 Anxiety disorder, unspecified: Secondary | ICD-10-CM

## 2014-03-23 MED ORDER — CLONAZEPAM 2 MG PO TABS: 2.0000 mg | ORAL_TABLET | Freq: Three times a day (TID) | ORAL | Status: DC | PRN

## 2014-03-23 NOTE — Patient Instructions (Signed)
Back Pain, Adult Low back pain is very common. About 1 in 5 people have back pain.The cause of low back pain is rarely dangerous. The pain often gets better over time.About half of people with a sudden onset of back pain feel better in just 2 weeks. About 8 in 10 people feel better by 6 weeks.  CAUSES Some common causes of back pain include:  Strain of the muscles or ligaments supporting the spine.  Wear and tear (degeneration) of the spinal discs.  Arthritis.  Direct injury to the back. DIAGNOSIS Most of the time, the direct cause of low back pain is not known.However, back pain can be treated effectively even when the exact cause of the pain is unknown.Answering your caregiver's questions about your overall health and symptoms is one of the most accurate ways to make sure the cause of your pain is not dangerous. If your caregiver needs more information, he or she may order lab work or imaging tests (X-rays or MRIs).However, even if imaging tests show changes in your back, this usually does not require surgery. HOME CARE INSTRUCTIONS For many people, back pain returns.Since low back pain is rarely dangerous, it is often a condition that people can learn to manageon their own.   Remain active. It is stressful on the back to sit or stand in one place. Do not sit, drive, or stand in one place for more than 30 minutes at a time. Take short walks on level surfaces as soon as pain allows.Try to increase the length of time you walk each day.  Do not stay in bed.Resting more than 1 or 2 days can delay your recovery.  Do not avoid exercise or work.Your body is made to move.It is not dangerous to be active, even though your back may hurt.Your back will likely heal faster if you return to being active before your pain is gone.  Pay attention to your body when you bend and lift. Many people have less discomfortwhen lifting if they bend their knees, keep the load close to their bodies,and  avoid twisting. Often, the most comfortable positions are those that put less stress on your recovering back.  Find a comfortable position to sleep. Use a firm mattress and lie on your side with your knees slightly bent. If you lie on your back, put a pillow under your knees.  Only take over-the-counter or prescription medicines as directed by your caregiver. Over-the-counter medicines to reduce pain and inflammation are often the most helpful.Your caregiver may prescribe muscle relaxant drugs.These medicines help dull your pain so you can more quickly return to your normal activities and healthy exercise.  Put ice on the injured area.  Put ice in a plastic bag.  Place a towel between your skin and the bag.  Leave the ice on for 15-20 minutes, 03-04 times a day for the first 2 to 3 days. After that, ice and heat may be alternated to reduce pain and spasms.  Ask your caregiver about trying back exercises and gentle massage. This may be of some benefit.  Avoid feeling anxious or stressed.Stress increases muscle tension and can worsen back pain.It is important to recognize when you are anxious or stressed and learn ways to manage it.Exercise is a great option. SEEK MEDICAL CARE IF:  You have pain that is not relieved with rest or medicine.  You have pain that does not improve in 1 week.  You have new symptoms.  You are generally not feeling well. SEEK   IMMEDIATE MEDICAL CARE IF:   You have pain that radiates from your back into your legs.  You develop new bowel or bladder control problems.  You have unusual weakness or numbness in your arms or legs.  You develop nausea or vomiting.  You develop abdominal pain.  You feel faint. Document Released: 06/08/2005 Document Revised: 12/08/2011 Document Reviewed: 10/10/2013 ExitCare Patient Information 2015 [employer]. This information is not intended to replace advice given to you by your health care provider. Make sure you  discuss any questions you have with your health care provider. DASH Eating Plan DASH stands for "Dietary Approaches to Stop Hypertension." The DASH eating plan is a healthy eating plan that has been shown to reduce high blood pressure (hypertension). Additional health benefits may include reducing the risk of type 2 diabetes mellitus, heart disease, and stroke. The DASH eating plan may also help with weight loss. WHAT DO I NEED TO KNOW ABOUT THE DASH EATING PLAN? For the DASH eating plan, you will follow these general guidelines:  Choose foods with a percent daily value for sodium of less than 5% (as listed on the food label).  Use salt-free seasonings or herbs instead of table salt or sea salt.  Check with your health care provider or pharmacist before using salt substitutes.  Eat lower-sodium products, often labeled as "lower sodium" or "no salt added."  Eat fresh foods.  Eat more vegetables, fruits, and low-fat dairy products.  Choose whole grains. Look for the word "whole" as the first word in the ingredient list.  Choose fish and skinless chicken or turkey more often than red meat. Limit fish, poultry, and meat to 6 oz (170 g) each day.  Limit sweets, desserts, sugars, and sugary drinks.  Choose heart-healthy fats.  Limit cheese to 1 oz (28 g) per day.  Eat more home-cooked food and less restaurant, buffet, and fast food.  Limit fried foods.  Cook foods using methods other than frying.  Limit canned vegetables. If you do use them, rinse them well to decrease the sodium.  When eating at a restaurant, ask that your food be prepared with less salt, or no salt if possible. WHAT FOODS CAN I EAT? Seek help from a dietitian for individual calorie needs. Grains Whole grain or whole wheat bread. Brown rice. Whole grain or whole wheat pasta. Quinoa, bulgur, and whole grain cereals. Low-sodium cereals. Corn or whole wheat flour tortillas. Whole grain cornbread. Whole grain crackers.  Low-sodium crackers. Vegetables Fresh or frozen vegetables (raw, steamed, roasted, or grilled). Low-sodium or reduced-sodium tomato and vegetable juices. Low-sodium or reduced-sodium tomato sauce and paste. Low-sodium or reduced-sodium canned vegetables.  Fruits All fresh, canned (in natural juice), or frozen fruits. Meat and Other Protein Products Ground beef (85% or leaner), grass-fed beef, or beef trimmed of fat. Skinless chicken or turkey. Ground chicken or turkey. Pork trimmed of fat. All fish and seafood. Eggs. Dried beans, peas, or lentils. Unsalted nuts and seeds. Unsalted canned beans. Dairy Low-fat dairy products, such as skim or 1% milk, 2% or reduced-fat cheeses, low-fat ricotta or cottage cheese, or plain low-fat yogurt. Low-sodium or reduced-sodium cheeses. Fats and Oils Tub margarines without trans fats. Light or reduced-fat mayonnaise and salad dressings (reduced sodium). Avocado. Safflower, olive, or canola oils. Natural peanut or almond butter. Other Unsalted popcorn and pretzels. The items listed above may not be a complete list of recommended foods or beverages. Contact your dietitian for more options. WHAT FOODS ARE NOT RECOMMENDED? Grains White bread.   White pasta. White rice. Refined cornbread. Bagels and croissants. Crackers that contain trans fat. Vegetables Creamed or fried vegetables. Vegetables in a cheese sauce. Regular canned vegetables. Regular canned tomato sauce and paste. Regular tomato and vegetable juices. Fruits Dried fruits. Canned fruit in light or heavy syrup. Fruit juice. Meat and Other Protein Products Fatty cuts of meat. Ribs, chicken wings, bacon, sausage, bologna, salami, chitterlings, fatback, hot dogs, bratwurst, and packaged luncheon meats. Salted nuts and seeds. Canned beans with salt. Dairy Whole or 2% milk, cream, half-and-half, and cream cheese. Whole-fat or sweetened yogurt. Full-fat cheeses or blue cheese. Nondairy creamers and whipped  toppings. Processed cheese, cheese spreads, or cheese curds. Condiments Onion and garlic salt, seasoned salt, table salt, and sea salt. Canned and packaged gravies. Worcestershire sauce. Tartar sauce. Barbecue sauce. Teriyaki sauce. Soy sauce, including reduced sodium. Steak sauce. Fish sauce. Oyster sauce. Cocktail sauce. Horseradish. Ketchup and mustard. Meat flavorings and tenderizers. Bouillon cubes. Hot sauce. Tabasco sauce. Marinades. Taco seasonings. Relishes. Fats and Oils Butter, stick margarine, lard, shortening, ghee, and bacon fat. Coconut, palm kernel, or palm oils. Regular salad dressings. Other Pickles and olives. Salted popcorn and pretzels. The items listed above may not be a complete list of foods and beverages to avoid. Contact your dietitian for more information. WHERE CAN I FIND MORE INFORMATION? National Heart, Lung, and Blood Institute: www.nhlbi.nih.gov/health/health-topics/topics/dash/ Document Released: 05/28/2011 Document Revised: 10/23/2013 Document Reviewed: 04/12/2013 ExitCare Patient Information 2015 [employer]. This information is not intended to replace advice given to you by your health care provider. Make sure you discuss any questions you have with your health care provider. Hypertension Hypertension, commonly called high blood pressure, is when the force of blood pumping through your arteries is too strong. Your arteries are the blood vessels that carry blood from your heart throughout your body. A blood pressure reading consists of a higher number over a lower number, such as 110/72. The higher number (systolic) is the pressure inside your arteries when your heart pumps. The lower number (diastolic) is the pressure inside your arteries when your heart relaxes. Ideally you want your blood pressure below 120/80. Hypertension forces your heart to work harder to pump blood. Your arteries may become narrow or stiff. Having hypertension puts you at risk for heart  disease, stroke, and other problems.  RISK FACTORS Some risk factors for high blood pressure are controllable. Others are not.  Risk factors you cannot control include:   Race. You may be at higher risk if you are African American.  Age. Risk increases with age.  Gender. Men are at higher risk than women before age 45 years. After age 65, women are at higher risk than men. Risk factors you can control include:  Not getting enough exercise or physical activity.  Being overweight.  Getting too much fat, sugar, calories, or salt in your diet.  Drinking too much alcohol. SIGNS AND SYMPTOMS Hypertension does not usually cause signs or symptoms. Extremely high blood pressure (hypertensive crisis) may cause headache, anxiety, shortness of breath, and nosebleed. DIAGNOSIS  To check if you have hypertension, your health care provider will measure your blood pressure while you are seated, with your arm held at the level of your heart. It should be measured at least twice using the same arm. Certain conditions can cause a difference in blood pressure between your right and left arms. A blood pressure reading that is higher than normal on one occasion does not mean that you need treatment. If one blood   pressure reading is high, ask your health care provider about having it checked again. TREATMENT  Treating high blood pressure includes making lifestyle changes and possibly taking medicine. Living a healthy lifestyle can help lower high blood pressure. You may need to change some of your habits. Lifestyle changes may include:  Following the DASH diet. This diet is high in fruits, vegetables, and whole grains. It is low in salt, red meat, and added sugars.  Getting at least 2 hours of brisk physical activity every week.  Losing weight if necessary.  Not smoking.  Limiting alcoholic beverages.  Learning ways to reduce stress. If lifestyle changes are not enough to get your blood pressure  under control, your health care provider may prescribe medicine. You may need to take more than one. Work closely with your health care provider to understand the risks and benefits. HOME CARE INSTRUCTIONS  Have your blood pressure rechecked as directed by your health care provider.   Take medicines only as directed by your health care provider. Follow the directions carefully. Blood pressure medicines must be taken as prescribed. The medicine does not work as well when you skip doses. Skipping doses also puts you at risk for problems.   Do not smoke.   Monitor your blood pressure at home as directed by your health care provider. SEEK MEDICAL CARE IF:   You think you are having a reaction to medicines taken.  You have recurrent headaches or feel dizzy.  You have swelling in your ankles.  You have trouble with your vision. SEEK IMMEDIATE MEDICAL CARE IF:  You develop a severe headache or confusion.  You have unusual weakness, numbness, or feel faint.  You have severe chest or abdominal pain.  You vomit repeatedly.  You have trouble breathing. MAKE SURE YOU:   Understand these instructions.  Will watch your condition.  Will get help right away if you are not doing well or get worse. Document Released: 06/08/2005 Document Revised: 10/23/2013 Document Reviewed: 03/31/2013 ExitCare Patient Information 2015 [employer]. This information is not intended to replace advice given to you by your health care provider. Make sure you discuss any questions you have with your health care provider.  

## 2014-03-23 NOTE — Progress Notes (Signed)
   Subjective:    Patient ID: Gail LinesDonna Spitler, female    DOB: 06/07/1956, 58 y.o.   MRN: [id number]  Hypertension Pertinent negatives include no headaches, palpitations or shortness of breath.   Pt presents to the office for follow-up on hypertension. Pt states she has restarted all of her blood pressure medications. Pt's BP is improved since last visit, but still not at goal. PT states she is have back pain a 10 out 10. Pt states this pain is constant and feels like her "back is going to break if she moves'.    Review of Systems  Constitutional: Negative.   HENT: Negative.   Eyes: Negative.   Respiratory: Negative.  Negative for shortness of breath.   Cardiovascular: Negative.  Negative for palpitations.  Gastrointestinal: Negative.   Endocrine: Negative.   Genitourinary: Negative.   Musculoskeletal: Positive for back pain.  Neurological: Negative.  Negative for headaches.  Hematological: Negative.   Psychiatric/Behavioral: Negative.   All other systems reviewed and are negative.      Objective:   Physical Exam  Vitals reviewed. Constitutional: She is oriented to person, place, and time. She appears well-developed and well-nourished. No distress.  Eyes: Pupils are equal, round, and reactive to light.  Neck: Normal range of motion. Neck supple. No thyromegaly present.  Cardiovascular: Normal rate, regular rhythm, normal heart sounds and intact distal pulses.   No murmur heard. Pulmonary/Chest: Effort normal and breath sounds normal. No respiratory distress. She has no wheezes.  Abdominal: Soft. Bowel sounds are normal. She exhibits no distension. There is no tenderness.  Musculoskeletal: Normal range of motion. She exhibits no edema and no tenderness.  Limited ROM of back r/t pain   Neurological: She is alert and oriented to person, place, and time. She has normal reflexes. No cranial nerve deficit.  Skin: Skin is warm and dry.  Psychiatric: She has a normal mood and affect. Her  behavior is normal. Judgment and thought content normal.      BP 138/78  Pulse 80  Temp(Src) 98.3 F (36.8 C) (Oral)  Ht 5\' 6"  (1.676 m)  Wt 269 lb 9.6 oz (122.29 kg)  BMI 43.54 kg/m2     Assessment & Plan:  1. Essential hypertension -Dash diet information given -Exercise encouraged - Stress Management  -Continue current meds -RTO prn and keep chronic follow-up appointment  2. Midline low back pain with left-sided sciatica -Rest - Ambulatory referral to Pain Clinic  Jannifer Rodneyhristy Aniqa Hare, FNP

## 2014-03-24 ENCOUNTER — Encounter (HOSPITAL_COMMUNITY): Payer: Self-pay | Admitting: Emergency Medicine

## 2014-03-24 ENCOUNTER — Inpatient Hospital Stay (HOSPITAL_COMMUNITY)
Admission: EM | Admit: 2014-03-24 | Discharge: 2014-03-28 | DRG: 190 | Disposition: A | Discharge status: Home / Self Care | Payer: Medicare HMO | Attending: Internal Medicine | Admitting: Internal Medicine

## 2014-03-24 ENCOUNTER — Emergency Department (HOSPITAL_COMMUNITY): Payer: Medicare HMO

## 2014-03-24 DIAGNOSIS — R5081 Fever presenting with conditions classified elsewhere: Secondary | ICD-10-CM | POA: Diagnosis present

## 2014-03-24 DIAGNOSIS — Z79899 Other long term (current) drug therapy: Secondary | ICD-10-CM

## 2014-03-24 DIAGNOSIS — B370 Candidal stomatitis: Secondary | ICD-10-CM

## 2014-03-24 DIAGNOSIS — F17200 Nicotine dependence, unspecified, uncomplicated: Secondary | ICD-10-CM | POA: Diagnosis present

## 2014-03-24 DIAGNOSIS — F32A Depression, unspecified: Secondary | ICD-10-CM

## 2014-03-24 DIAGNOSIS — N289 Disorder of kidney and ureter, unspecified: Secondary | ICD-10-CM | POA: Diagnosis present

## 2014-03-24 DIAGNOSIS — R0902 Hypoxemia: Secondary | ICD-10-CM

## 2014-03-24 DIAGNOSIS — Z794 Long term (current) use of insulin: Secondary | ICD-10-CM

## 2014-03-24 DIAGNOSIS — J45909 Unspecified asthma, uncomplicated: Secondary | ICD-10-CM | POA: Diagnosis present

## 2014-03-24 DIAGNOSIS — Z8249 Family history of ischemic heart disease and other diseases of the circulatory system: Secondary | ICD-10-CM

## 2014-03-24 DIAGNOSIS — Z6841 Body Mass Index (BMI) 40.0 and over, adult: Secondary | ICD-10-CM

## 2014-03-24 DIAGNOSIS — J9601 Acute respiratory failure with hypoxia: Secondary | ICD-10-CM

## 2014-03-24 DIAGNOSIS — E669 Obesity, unspecified: Secondary | ICD-10-CM | POA: Diagnosis present

## 2014-03-24 DIAGNOSIS — J040 Acute laryngitis: Secondary | ICD-10-CM | POA: Diagnosis present

## 2014-03-24 DIAGNOSIS — R6510 Systemic inflammatory response syndrome (SIRS) of non-infectious origin without acute organ dysfunction: Secondary | ICD-10-CM

## 2014-03-24 DIAGNOSIS — M797 Fibromyalgia: Secondary | ICD-10-CM | POA: Diagnosis present

## 2014-03-24 DIAGNOSIS — Z809 Family history of malignant neoplasm, unspecified: Secondary | ICD-10-CM

## 2014-03-24 DIAGNOSIS — J189 Pneumonia, unspecified organism: Secondary | ICD-10-CM

## 2014-03-24 DIAGNOSIS — I10 Essential (primary) hypertension: Secondary | ICD-10-CM | POA: Diagnosis present

## 2014-03-24 DIAGNOSIS — E876 Hypokalemia: Secondary | ICD-10-CM

## 2014-03-24 DIAGNOSIS — F419 Anxiety disorder, unspecified: Secondary | ICD-10-CM

## 2014-03-24 DIAGNOSIS — G9340 Encephalopathy, unspecified: Secondary | ICD-10-CM

## 2014-03-24 DIAGNOSIS — R748 Abnormal levels of other serum enzymes: Secondary | ICD-10-CM

## 2014-03-24 DIAGNOSIS — J441 Chronic obstructive pulmonary disease with (acute) exacerbation: Secondary | ICD-10-CM | POA: Diagnosis not present

## 2014-03-24 DIAGNOSIS — E119 Type 2 diabetes mellitus without complications: Secondary | ICD-10-CM | POA: Diagnosis present

## 2014-03-24 DIAGNOSIS — E785 Hyperlipidemia, unspecified: Secondary | ICD-10-CM | POA: Diagnosis present

## 2014-03-24 DIAGNOSIS — E860 Dehydration: Secondary | ICD-10-CM | POA: Diagnosis present

## 2014-03-24 DIAGNOSIS — E118 Type 2 diabetes mellitus with unspecified complications: Secondary | ICD-10-CM

## 2014-03-24 DIAGNOSIS — A419 Sepsis, unspecified organism: Secondary | ICD-10-CM

## 2014-03-24 DIAGNOSIS — F329 Major depressive disorder, single episode, unspecified: Secondary | ICD-10-CM

## 2014-03-24 DIAGNOSIS — M5442 Lumbago with sciatica, left side: Secondary | ICD-10-CM

## 2014-03-24 DIAGNOSIS — J9600 Acute respiratory failure, unspecified whether with hypoxia or hypercapnia: Secondary | ICD-10-CM

## 2014-03-24 LAB — CBC WITH DIFFERENTIAL/PLATELET
BASOS PCT: 0 % (ref 0–1)
Basophils Absolute: 0 10*3/uL (ref 0.0–0.1)
EOS ABS: 0.2 10*3/uL (ref 0.0–0.7)
Eosinophils Relative: 2 % (ref 0–5)
HEMATOCRIT: 37.7 % (ref 36.0–46.0)
Hemoglobin: 12.4 g/dL (ref 12.0–15.0)
Lymphocytes Relative: 25 % (ref 12–46)
Lymphs Abs: 1.7 10*3/uL (ref 0.7–4.0)
MCH: 31.8 pg (ref 26.0–34.0)
MCHC: 32.9 g/dL (ref 30.0–36.0)
MCV: 96.7 fL (ref 78.0–100.0)
MONO ABS: 0.6 10*3/uL (ref 0.1–1.0)
MONOS PCT: 9 % (ref 3–12)
NEUTROS PCT: 64 % (ref 43–77)
Neutro Abs: 4.4 10*3/uL (ref 1.7–7.7)
Platelets: 258 10*3/uL (ref 150–400)
RBC: 3.9 MIL/uL (ref 3.87–5.11)
RDW: 13.9 % (ref 11.5–15.5)
WBC: 6.9 10*3/uL (ref 4.0–10.5)

## 2014-03-24 LAB — URINALYSIS, ROUTINE W REFLEX MICROSCOPIC
BILIRUBIN URINE: NEGATIVE
Glucose, UA: 100 mg/dL — AB
HGB URINE DIPSTICK: NEGATIVE
Ketones, ur: NEGATIVE mg/dL
Leukocytes, UA: NEGATIVE
NITRITE: NEGATIVE
PROTEIN: NEGATIVE mg/dL
SPECIFIC GRAVITY, URINE: 1.01 (ref 1.005–1.030)
Urobilinogen, UA: 1 mg/dL (ref 0.0–1.0)
pH: 6.5 (ref 5.0–8.0)

## 2014-03-24 LAB — BLOOD GAS, ARTERIAL
ACID-BASE EXCESS: 1.2 mmol/L (ref 0.0–2.0)
BICARBONATE: 25.9 meq/L — AB (ref 20.0–24.0)
Drawn by: 234301
O2 Content: 3 L/min
O2 SAT: 90.1 %
PO2 ART: 58.9 mmHg — AB (ref 80.0–100.0)
TCO2: 24.2 mmol/L (ref 0–100)
pCO2 arterial: 45.4 mmHg — ABNORMAL HIGH (ref 35.0–45.0)
pH, Arterial: 7.374 (ref 7.350–7.450)

## 2014-03-24 LAB — COMPREHENSIVE METABOLIC PANEL
ALK PHOS: 65 U/L (ref 39–117)
ALT: 46 U/L — ABNORMAL HIGH (ref 0–35)
AST: 36 U/L (ref 0–37)
Albumin: 3.5 g/dL (ref 3.5–5.2)
Anion gap: 13 (ref 5–15)
BILIRUBIN TOTAL: 0.4 mg/dL (ref 0.3–1.2)
BUN: 22 mg/dL (ref 6–23)
CHLORIDE: 97 meq/L (ref 96–112)
CO2: 29 mEq/L (ref 19–32)
Calcium: 9.6 mg/dL (ref 8.4–10.5)
Creatinine, Ser: 1.36 mg/dL — ABNORMAL HIGH (ref 0.50–1.10)
GFR calc non Af Amer: 43 mL/min — ABNORMAL LOW (ref >90)
GFR, EST AFRICAN AMERICAN: 49 mL/min — AB (ref >90)
Glucose, Bld: 117 mg/dL — ABNORMAL HIGH (ref 70–99)
Potassium: 4.1 mEq/L (ref 3.7–5.3)
Sodium: 139 mEq/L (ref 137–147)
TOTAL PROTEIN: 7.2 g/dL (ref 6.0–8.3)

## 2014-03-24 LAB — GLUCOSE, CAPILLARY: Glucose-Capillary: 217 mg/dL — ABNORMAL HIGH (ref 70–99)

## 2014-03-24 LAB — I-STAT CG4 LACTIC ACID, ED: Lactic Acid, Venous: 1.96 mmol/L (ref 0.5–2.2)

## 2014-03-24 MED ORDER — METHYLPREDNISOLONE SODIUM SUCC 125 MG IJ SOLR
60.0000 mg | Freq: Two times a day (BID) | INTRAMUSCULAR | Status: DC
  Administered 2014-03-25: 60 mg via INTRAVENOUS
  Filled 2014-03-24: qty 2

## 2014-03-24 MED ORDER — BENAZEPRIL-HYDROCHLOROTHIAZIDE 20-25 MG PO TABS: 1.0000 | ORAL_TABLET | Freq: Every day | ORAL | Status: DC

## 2014-03-24 MED ORDER — SODIUM CHLORIDE 0.9 % IV BOLUS (SEPSIS)
30.0000 mL/kg | Freq: Once | INTRAVENOUS | Status: AC
  Administered 2014-03-24: 3669 mL via INTRAVENOUS

## 2014-03-24 MED ORDER — ENOXAPARIN SODIUM 40 MG/0.4ML ~~LOC~~ SOLN
40.0000 mg | SUBCUTANEOUS | Status: DC
  Administered 2014-03-24 – 2014-03-27 (×4): 40 mg via SUBCUTANEOUS
  Filled 2014-03-24 (×4): qty 0.4

## 2014-03-24 MED ORDER — IPRATROPIUM BROMIDE 0.02 % IN SOLN
0.5000 mg | Freq: Four times a day (QID) | RESPIRATORY_TRACT | Status: DC
Start: 2014-03-24 — End: 2014-03-25
  Administered 2014-03-24 – 2014-03-25 (×2): 0.5 mg via RESPIRATORY_TRACT
  Filled 2014-03-24 (×2): qty 2.5

## 2014-03-24 MED ORDER — METHYLPREDNISOLONE SODIUM SUCC 125 MG IJ SOLR
125.0000 mg | Freq: Once | INTRAMUSCULAR | Status: AC
  Administered 2014-03-24: 125 mg via INTRAVENOUS
  Filled 2014-03-24: qty 2

## 2014-03-24 MED ORDER — VANCOMYCIN HCL IN DEXTROSE 1-5 GM/200ML-% IV SOLN
1000.0000 mg | Freq: Once | INTRAVENOUS | Status: AC
  Administered 2014-03-24: 1000 mg via INTRAVENOUS
  Filled 2014-03-24: qty 200

## 2014-03-24 MED ORDER — CARVEDILOL 12.5 MG PO TABS
25.0000 mg | ORAL_TABLET | Freq: Two times a day (BID) | ORAL | Status: DC
  Administered 2014-03-25 – 2014-03-28 (×6): 25 mg via ORAL
  Filled 2014-03-24 (×7): qty 2

## 2014-03-24 MED ORDER — LEVOFLOXACIN IN D5W 750 MG/150ML IV SOLN
750.0000 mg | Freq: Once | INTRAVENOUS | Status: AC
  Administered 2014-03-24: 750 mg via INTRAVENOUS
  Filled 2014-03-24: qty 150

## 2014-03-24 MED ORDER — ATORVASTATIN CALCIUM 40 MG PO TABS
40.0000 mg | ORAL_TABLET | Freq: Every day | ORAL | Status: DC
  Administered 2014-03-25 – 2014-03-28 (×4): 40 mg via ORAL
  Filled 2014-03-24: qty 2
  Filled 2014-03-24 (×3): qty 1

## 2014-03-24 MED ORDER — ONDANSETRON HCL 4 MG PO TABS: 4.0000 mg | ORAL_TABLET | Freq: Four times a day (QID) | ORAL | Status: DC | PRN

## 2014-03-24 MED ORDER — BENAZEPRIL HCL 10 MG PO TABS
20.0000 mg | ORAL_TABLET | Freq: Every day | ORAL | Status: DC
  Administered 2014-03-25 – 2014-03-28 (×4): 20 mg via ORAL
  Filled 2014-03-24 (×4): qty 2

## 2014-03-24 MED ORDER — CLONIDINE HCL 0.2 MG PO TABS
0.3000 mg | ORAL_TABLET | Freq: Two times a day (BID) | ORAL | Status: DC
  Administered 2014-03-24 – 2014-03-28 (×8): 0.3 mg via ORAL
  Filled 2014-03-24 (×16): qty 1

## 2014-03-24 MED ORDER — INSULIN GLARGINE 300 UNIT/ML ~~LOC~~ SOPN: 40.0000 [IU] | PEN_INJECTOR | Freq: Every day | SUBCUTANEOUS | Status: DC

## 2014-03-24 MED ORDER — GABAPENTIN 300 MG PO CAPS
300.0000 mg | ORAL_CAPSULE | Freq: Two times a day (BID) | ORAL | Status: DC | PRN
  Filled 2014-03-24 (×2): qty 1

## 2014-03-24 MED ORDER — ACETAMINOPHEN 325 MG PO TABS
650.0000 mg | ORAL_TABLET | Freq: Four times a day (QID) | ORAL | Status: DC | PRN
  Administered 2014-03-25 – 2014-03-27 (×4): 650 mg via ORAL
  Filled 2014-03-24 (×5): qty 2

## 2014-03-24 MED ORDER — HYDROCHLOROTHIAZIDE 25 MG PO TABS
25.0000 mg | ORAL_TABLET | Freq: Every day | ORAL | Status: DC
  Administered 2014-03-25 – 2014-03-28 (×4): 25 mg via ORAL
  Filled 2014-03-24 (×4): qty 1

## 2014-03-24 MED ORDER — INSULIN ASPART 100 UNIT/ML ~~LOC~~ SOLN
0.0000 [IU] | Freq: Three times a day (TID) | SUBCUTANEOUS | Status: DC
  Administered 2014-03-25: 4 [IU] via SUBCUTANEOUS
  Administered 2014-03-25: 7 [IU] via SUBCUTANEOUS
  Administered 2014-03-25: 4 [IU] via SUBCUTANEOUS
  Administered 2014-03-26 (×3): 7 [IU] via SUBCUTANEOUS
  Administered 2014-03-27: 11 [IU] via SUBCUTANEOUS
  Administered 2014-03-27 (×2): 7 [IU] via SUBCUTANEOUS
  Administered 2014-03-28: 4 [IU] via SUBCUTANEOUS
  Administered 2014-03-28: 7 [IU] via SUBCUTANEOUS

## 2014-03-24 MED ORDER — ONDANSETRON HCL 4 MG/2ML IJ SOLN: 4.0000 mg | Freq: Four times a day (QID) | INTRAMUSCULAR | Status: DC | PRN

## 2014-03-24 MED ORDER — GUAIFENESIN ER 600 MG PO TB12
600.0000 mg | ORAL_TABLET | Freq: Two times a day (BID) | ORAL | Status: DC
  Administered 2014-03-24 – 2014-03-28 (×8): 600 mg via ORAL
  Filled 2014-03-24 (×8): qty 1

## 2014-03-24 MED ORDER — LEVOFLOXACIN IN D5W 500 MG/100ML IV SOLN
500.0000 mg | INTRAVENOUS | Status: DC
  Administered 2014-03-25: 500 mg via INTRAVENOUS
  Filled 2014-03-24 (×2): qty 100

## 2014-03-24 MED ORDER — INSULIN GLARGINE 100 UNIT/ML ~~LOC~~ SOLN
40.0000 [IU] | Freq: Every day | SUBCUTANEOUS | Status: DC
  Administered 2014-03-24 – 2014-03-25 (×2): 40 [IU] via SUBCUTANEOUS
  Filled 2014-03-24 (×3): qty 0.4

## 2014-03-24 MED ORDER — CLONAZEPAM 0.5 MG PO TABS
2.0000 mg | ORAL_TABLET | Freq: Three times a day (TID) | ORAL | Status: DC | PRN
  Administered 2014-03-25 – 2014-03-28 (×6): 2 mg via ORAL
  Filled 2014-03-24 (×6): qty 4

## 2014-03-24 MED ORDER — IPRATROPIUM BROMIDE 0.02 % IN SOLN
0.5000 mg | Freq: Once | RESPIRATORY_TRACT | Status: AC
  Administered 2014-03-24: 0.5 mg via RESPIRATORY_TRACT
  Filled 2014-03-24: qty 2.5

## 2014-03-24 MED ORDER — SODIUM CHLORIDE 0.9 % IV SOLN
1000.0000 mL | INTRAVENOUS | Status: DC
  Administered 2014-03-25 – 2014-03-28 (×9): 1000 mL via INTRAVENOUS

## 2014-03-24 MED ORDER — GABAPENTIN 300 MG PO CAPS
300.0000 mg | ORAL_CAPSULE | Freq: Three times a day (TID) | ORAL | Status: DC
  Administered 2014-03-24 – 2014-03-28 (×11): 300 mg via ORAL
  Filled 2014-03-24 (×10): qty 1

## 2014-03-24 MED ORDER — VANCOMYCIN HCL IN DEXTROSE 1-5 GM/200ML-% IV SOLN: 1000.0000 mg | Freq: Once | INTRAVENOUS | Status: DC

## 2014-03-24 MED ORDER — ALUM & MAG HYDROXIDE-SIMETH 200-200-20 MG/5ML PO SUSP: 30.0000 mL | Freq: Four times a day (QID) | ORAL | Status: DC | PRN

## 2014-03-24 MED ORDER — ACETAMINOPHEN 650 MG RE SUPP: 650.0000 mg | Freq: Four times a day (QID) | RECTAL | Status: DC | PRN

## 2014-03-24 MED ORDER — METFORMIN HCL ER 500 MG PO TB24
500.0000 mg | ORAL_TABLET | Freq: Two times a day (BID) | ORAL | Status: DC
  Administered 2014-03-25 – 2014-03-28 (×7): 500 mg via ORAL
  Filled 2014-03-24 (×11): qty 1

## 2014-03-24 MED ORDER — DOCUSATE SODIUM 100 MG PO CAPS: 100.0000 mg | ORAL_CAPSULE | Freq: Every day | ORAL | Status: DC | PRN

## 2014-03-24 MED ORDER — ALBUTEROL (5 MG/ML) CONTINUOUS INHALATION SOLN
10.0000 mg/h | INHALATION_SOLUTION | RESPIRATORY_TRACT | Status: DC
  Administered 2014-03-24: 10 mg/h via RESPIRATORY_TRACT
  Filled 2014-03-24: qty 20

## 2014-03-24 MED ORDER — ALBUTEROL SULFATE (2.5 MG/3ML) 0.083% IN NEBU
2.5000 mg | INHALATION_SOLUTION | RESPIRATORY_TRACT | Status: AC | PRN
  Administered 2014-03-24 – 2014-03-25 (×2): 2.5 mg via RESPIRATORY_TRACT
  Filled 2014-03-24 (×2): qty 3

## 2014-03-24 NOTE — ED Notes (Signed)
BP 168/118 is on wrong pt.

## 2014-03-24 NOTE — ED Notes (Signed)
Resp informed of ABG order.

## 2014-03-24 NOTE — H&P (Signed)
History and Physical  Gail Preston WUJ:[id number] DOB: 04/30/1956 DOA: 03/24/2014  Referring physician: Dr Fonnie Jarvis, ED physician PCP: Rudi Heap, MD   Chief Complaint: Fever, shortness of breath  HPI: Gail Preston is a 58 y.o. female  With a history of diabetes, asthma, COPD, hypertension, anxiety who was brought to the emergency department by EMS. The patient has been tired and weak over the past 48 hours. She went her primary care physician's office yesterday and was evaluated. She received a flu vaccine. She states that she had felt like she was having a cold prior to being seen, but that her symptoms worsened after being seen at her PCPs office. When EMS arrived at her residence, code sepsis was called due to her hypersomnolence. She receives her of liters of fluid in the emergency carbonate was started on empiric antibiotics. Additionally, she has a cough that is nonproductive, but loose and rattly. She also admits to wheezing which is been increasing since her illness began. No PA or provoking factors.   Review of Systems:   Pt denies any chest pain, abdominal pain, nausea, vomiting, diarrhea, constipation, rash.  Review of systems are otherwise negative  Past Medical History  Diagnosis Date  . Fibromyalgia   . Hypertension   . Hyperlipidemia   . Pneumonia   . Diabetes mellitus without complication   . Depression    Past Surgical History  Procedure Laterality Date  . Tubal ligation    . Fracture surgery    . Tonsillectomy Bilateral   . Carpal tunnel release Right    Social History:  reports that she has been smoking.  She does not have any smokeless tobacco history on file. She reports that she does not drink alcohol or use illicit drugs. Patient lives at home & is able to participate in activities of daily living without assistance  Allergies  Allergen Reactions  . Nsaids Anaphylaxis  . Penicillins Anaphylaxis    Family History  Problem Relation Age of Onset  .  Cancer Mother   . Heart disease Father       Prior to Admission medications   Medication Sig Start Date End Date Taking? Authorizing Provider  atorvastatin (LIPITOR) 40 MG tablet Take 40 mg by mouth daily.   Yes Historical Provider, MD  benazepril-hydrochlorthiazide (LOTENSIN HCT) 20-25 MG per tablet Take 1 tablet by mouth daily. 02/22/14  Yes Junie Spencer, FNP  carvedilol (COREG) 25 MG tablet Take 1 tablet (25 mg total) by mouth 2 (two) times daily with a meal. 02/22/14  Yes Junie Spencer, FNP  clonazePAM (KLONOPIN) 2 MG tablet Take 1 tablet (2 mg total) by mouth 3 (three) times daily as needed. 03/23/14  Yes Junie Spencer, FNP  cloNIDine (CATAPRES) 0.3 MG tablet Take 1 tablet (0.3 mg total) by mouth 2 (two) times daily. 02/22/14  Yes Junie Spencer, FNP  gabapentin (NEURONTIN) 300 MG capsule Take 300 mg by mouth 2 (two) times daily as needed (Nerve Pain). 02/22/14  Yes Junie Spencer, FNP  insulin aspart (NOVOLOG) 100 UNIT/ML injection Inject 5 Units into the skin as needed. Inject 5 unit as needed for BG great than 250.  Call office for insulin adjustment is needed more than 2 times per week 02/22/14  Yes Junie Spencer, FNP  Insulin Glargine (TOUJEO SOLOSTAR) 300 UNIT/ML SOPN Inject 40 Units into the skin at bedtime. 02/22/14  Yes Junie Spencer, FNP  metFORMIN (GLUCOPHAGE-XR) 500 MG 24 hr tablet Take 1 tablet (500 mg  total) by mouth 2 (two) times daily. 02/22/14  Yes Junie Spencer, FNP    Physical Exam: BP 122/51  Pulse 95  Temp(Src) 101 F (38.3 C) (Oral)  Resp 20  Ht 5\' 6"  (1.676 m)  Wt 124.7 kg (274 lb 14.6 oz)  BMI 44.39 kg/m2  SpO2 92%  General: Middle-age Caucasian female. Awake and alert and oriented x3. On nasal cannula with mild tachypnea and labored breathing Eyes: Pupils equal, round, reactive to light. Extraocular muscles are intact. Sclerae anicteric and noninjected.  ENT:  Moist mucosal membranes. No mucosal lesions.   Neck: Neck supple without lymphadenopathy. No  carotid bruits. No masses palpated.  Cardiovascular: Regular rate with normal S1-S2 sounds. No murmurs, rubs, gallops auscultated. No JVD.  Respiratory: Prolonged exhalation phase. Coarse expiratory wheezes. No rales  Abdomen: Soft, nontender, nondistended. Hyperactive bowel sounds. No masses or hepatosplenomegaly  Skin: Dry, warm to touch. 2+ dorsalis pedis and radial pulses. Musculoskeletal: No calf or leg pain. All major joints not erythematous nontender.  Psychiatric: Intact judgment and insight.  Neurologic: No focal neurological deficits. Cranial nerves II through XII are grossly intact.           Labs on Admission:  Basic Metabolic Panel:  Recent Labs Lab 03/24/14 1652  NA 139  K 4.1  CL 97  CO2 29  GLUCOSE 117*  BUN 22  CREATININE 1.36*  CALCIUM 9.6   Liver Function Tests:  Recent Labs Lab 03/24/14 1652  AST 36  ALT 46*  ALKPHOS 65  BILITOT 0.4  PROT 7.2  ALBUMIN 3.5   No results found for this basename: LIPASE, AMYLASE,  in the last 168 hours No results found for this basename: AMMONIA,  in the last 168 hours CBC:  Recent Labs Lab 03/24/14 1652  WBC 6.9  NEUTROABS 4.4  HGB 12.4  HCT 37.7  MCV 96.7  PLT 258   Cardiac Enzymes: No results found for this basename: CKTOTAL, CKMB, CKMBINDEX, TROPONINI,  in the last 168 hours  BNP (last 3 results) No results found for this basename: PROBNP,  in the last 8760 hours CBG: No results found for this basename: GLUCAP,  in the last 168 hours  Radiological Exams on Admission: Dg Chest Port 1 View  03/24/2014   CLINICAL DATA:  Patient is lethargic, week, short of breath with altered mental status  EXAM: PORTABLE CHEST - 1 VIEW  COMPARISON:  09/02/2012  FINDINGS: Mild cardiac enlargement. Mild to moderate vascular congestion more prominent when compared to the prior study. Discoid atelectasis right middle lobe. There is perihilar peribronchial wall thickening. No evidence of interstitial or alveolar pulmonary  edema.  IMPRESSION: Stable cardiac enlargement with increased vascular congestion and bilateral bronchial wall thickening, findings which may suggest developing minimal pulmonary edema.   Electronically Signed   By: Esperanza Heir M.D.   On: 03/24/2014 17:36      Assessment/Plan Present on Admission:  . COPD with acute exacerbation . Fever presenting with conditions classified elsewhere  #1 COPD with acute exacerbation  no evidence of infiltrate on x-ray. Additionally, her white count is normal and without a left shift. The patient's wheezing is evidence of an acute COPD exacerbation. We'll admit the patient to medical floor and continue on levofloxacin for her COPD exacerbation. We'll continue the patient's IV steroids: Solu-Medrol 60 mg twice daily. Additionally, we'll continue with patient's albuterol and Atrovent treatments. Continue with Mucinex for mucolytic. Sepsis protocol stopped.  #2 fevers With a normal white count with no  increase in neutrophils, her symptoms are more likely to be a viral process and even possibly a viral pneumonia. Patient has no history of MRSA, therefore will discontinue the vancomycin, but we'll continue with the levofloxacin due to her COPD exacerbation.  #3 acute renal insufficiency This is likely due to dehydration. We'll fluid replace.  #4 diabetes Continue home medications cover with sliding scale. CBGs pre-meal and at bedtime  #5 hypertension  continue antihypertensives.  #6 anxiety  continue with Klonopin   DVT prophylaxis: Lovenox  Consultants: None  Code Status: Full code  Family Communication: None   Disposition Plan: Home following stabilization  Time spent: 70 minutes  Candelaria CelesteJacob Annagrace Carr, DO [facility] Pager [phone]  **Disclaimer: This note may have been dictated with voice recognition software. Similar sounding words can inadvertently be transcribed and this note may contain transcription errors which may not have been  corrected upon publication of note.**

## 2014-03-24 NOTE — Progress Notes (Addendum)
Pharmacy Note:  Initial antibiotics for Levaquin and Vancomycin ordered by EDP for CAP.  Estimated Creatinine Clearance: 88.4 ml/min (by C-G formula based on Cr of 0.95).   Allergies  Allergen Reactions  . Nsaids Anaphylaxis  . Penicillins Anaphylaxis    Filed Vitals:   03/24/14 1622  BP: 102/53  Pulse: 86  Temp: 102.4 F (39.1 C)  Resp: 23    Anti-infectives   Start     Dose/Rate Route Frequency Ordered Stop   03/24/14 1645  levofloxacin (LEVAQUIN) IVPB 750 mg     750 mg 100 mL/hr over 90 Minutes Intravenous  Once 03/24/14 1644     03/24/14 1645  vancomycin (VANCOCIN) IVPB 1000 mg/200 mL premix     1,000 mg 200 mL/hr over 60 Minutes Intravenous  Once 03/24/14 1644        Plan: Initial dosing by MD appropriate. F/U admission orders for further dosing if therapy continued. Additional 1000mg  of Vancomycin ordered for a total of 2000mg  initial loading dose.  Mady GemmaHayes, Jerrick Farve R, [facility] CharlesRPH 03/24/2014 5:13 PM

## 2014-03-24 NOTE — ED Notes (Signed)
Pharmacist called and would like to given 2nd dose of vancomycin (making a total of 2 g)  Thomasene Lotaylor James, RN floor nurse informed that second dose of vancomycin and 1669 ml of ns bolus need to be completed.  Pt being transferred to room 303.  Thomasene Lotaylor James, RN given pts family information with phone numbers of christopher Dimario and Olivia CanterLaura Talber.

## 2014-03-24 NOTE — ED Notes (Signed)
Non productive cough.  Crackles and rhonchi noted bilaterally.   o2 at 4l/m via n/c.

## 2014-03-24 NOTE — ED Notes (Signed)
MD at bedside. 

## 2014-03-24 NOTE — ED Provider Notes (Signed)
CSN: [id number]     Arrival date & time 03/24/14  1608 History  This chart was scribed for Hurman Horn, MD by Gwenyth Ober, ED Scribe. This patient was seen in room APA06/APA06 and the patient's care was started at 4:28 PM.   Chief Complaint  Patient presents with  . Altered Mental Status   The history is provided by the patient. No language interpreter was used.   HPI Comments: Gail Preston is a 58 y.o. female arrived via EMS who presents to the Emergency Department complaining of tiredness and weakness for the last 24 hours. Pt states she has fever, cough, SOB, and chills as associated symptoms. Pt denies confusion, vomiting, diarrhea or rash. Pt received flu shot yesterday. Pt lives by herself and smokes daily. Pt has history of diabetes, blood pressure and chronic pain.   Past Medical History  Diagnosis Date  . Fibromyalgia   . Hypertension   . Hyperlipidemia   . Pneumonia   . Diabetes mellitus without complication   . Depression    Past Surgical History  Procedure Laterality Date  . Tubal ligation    . Fracture surgery    . Tonsillectomy Bilateral   . Carpal tunnel release Right    Family History  Problem Relation Age of Onset  . Cancer Mother   . Heart disease Father    History  Substance Use Topics  . Smoking status: Current Every Day Smoker -- 0.50 packs/day for 12 years  . Smokeless tobacco: Not on file  . Alcohol Use: No   OB History   Grav Para Term Preterm Abortions TAB SAB Ect Mult Living                 Review of Systems  10 Systems reviewed and are negative for acute change except as noted in the HPI.  Allergies  Nsaids and Penicillins  Home Medications   Prior to Admission medications   Medication Sig Start Date End Date Taking? Authorizing Provider  atorvastatin (LIPITOR) 40 MG tablet Take 40 mg by mouth daily.   Yes Historical Provider, MD  benazepril-hydrochlorthiazide (LOTENSIN HCT) 20-25 MG per tablet Take 1 tablet by mouth daily.  02/22/14  Yes Junie Spencer, FNP  carvedilol (COREG) 25 MG tablet Take 1 tablet (25 mg total) by mouth 2 (two) times daily with a meal. 02/22/14  Yes Junie Spencer, FNP  clonazePAM (KLONOPIN) 2 MG tablet Take 1 tablet (2 mg total) by mouth 3 (three) times daily as needed. 03/23/14  Yes Junie Spencer, FNP  cloNIDine (CATAPRES) 0.3 MG tablet Take 1 tablet (0.3 mg total) by mouth 2 (two) times daily. 02/22/14  Yes Junie Spencer, FNP  gabapentin (NEURONTIN) 300 MG capsule Take 300 mg by mouth 2 (two) times daily as needed (Nerve Pain). 02/22/14  Yes Junie Spencer, FNP  insulin aspart (NOVOLOG) 100 UNIT/ML injection Inject 5 Units into the skin as needed. Inject 5 unit as needed for BG great than 250.  Call office for insulin adjustment is needed more than 2 times per week 02/22/14  Yes Junie Spencer, FNP  Insulin Glargine (TOUJEO SOLOSTAR) 300 UNIT/ML SOPN Inject 40 Units into the skin at bedtime. 02/22/14  Yes Junie Spencer, FNP  metFORMIN (GLUCOPHAGE-XR) 500 MG 24 hr tablet Take 1 tablet (500 mg total) by mouth 2 (two) times daily. 02/22/14  Yes [name] A Hawks, FNP   BP 156/72  Pulse 72  Temp(Src) 98.3 F (36.8 C) (Oral)  Resp  20  Ht 5\' 6"  (1.676 m)  Wt 277 lb 12.5 oz (126 kg)  BMI 44.86 kg/m2  SpO2 97% Physical Exam  Nursing note and vitals reviewed. Constitutional:  Awake, drowsy, but alert and following commands. Oriented to person, place and time. Nontoxic appearance.  HENT:  Head: Atraumatic.  Eyes: Right eye exhibits no discharge. Left eye exhibits no discharge.  Neck: Neck supple.  Cardiovascular: Normal rate, regular rhythm and normal heart sounds.   No murmur heard. Pulmonary/Chest: Effort normal. She exhibits no tenderness.  Mild respiratory distress at rest. Diffused wheezes and rhonchi. Speaks short phrases. Pulse ox hypoxic RA 76%  Abdominal: Soft. Bowel sounds are normal. She exhibits no distension. There is no tenderness. There is no rebound and no guarding.   Musculoskeletal: She exhibits no edema and no tenderness.  Baseline ROM, no obvious new focal weakness.  Neurological:  Mental status appears baseline for patient and situation. Motor strength generally weak. Too weak to sit or stand independently.  Awake, drowsy, but alert and following commands. Oriented to person, place and time. Nontoxic appearance.  Skin: No rash noted.  Psychiatric: She has a normal mood and affect.    ED Course  Procedures (including critical care time)  DIAGNOSTIC STUDIES: Oxygen Saturation is 93% on 4 L/m n/c, low by my interpretation.    COORDINATION OF CARE: 4:41 PM-Patient / Family / Caregiver informed of clinical course, understand medical decision-making process, and agree with plan. Pt stable in ED with no significant deterioration in condition. D/w [facility] for admit.  Labs Review Labs Reviewed  COMPREHENSIVE METABOLIC PANEL - Abnormal; Notable for the following:    Glucose, Bld 117 (*)    Creatinine, Ser 1.36 (*)    ALT 46 (*)    GFR calc non Af Amer 43 (*)    GFR calc Af Amer 49 (*)    All other components within normal limits  URINALYSIS, ROUTINE W REFLEX MICROSCOPIC - Abnormal; Notable for the following:    Glucose, UA 100 (*)    All other components within normal limits  BLOOD GAS, ARTERIAL - Abnormal; Notable for the following:    pCO2 arterial 45.4 (*)    pO2, Arterial 58.9 (*)    Bicarbonate 25.9 (*)    All other components within normal limits  CBC - Abnormal; Notable for the following:    RBC 3.82 (*)    Hemoglobin 11.9 (*)    All other components within normal limits  BASIC METABOLIC PANEL - Abnormal; Notable for the following:    Glucose, Bld 238 (*)    Calcium 8.2 (*)    GFR calc non Af Amer 67 (*)    GFR calc Af Amer 78 (*)    All other components within normal limits  GLUCOSE, CAPILLARY - Abnormal; Notable for the following:    Glucose-Capillary 217 (*)    All other components within normal limits  GLUCOSE, CAPILLARY -  Abnormal; Notable for the following:    Glucose-Capillary 198 (*)    All other components within normal limits  GLUCOSE, CAPILLARY - Abnormal; Notable for the following:    Glucose-Capillary 184 (*)    All other components within normal limits  GLUCOSE, CAPILLARY - Abnormal; Notable for the following:    Glucose-Capillary 232 (*)    All other components within normal limits  GLUCOSE, CAPILLARY - Abnormal; Notable for the following:    Glucose-Capillary 278 (*)    All other components within normal limits  GLUCOSE, CAPILLARY - Abnormal;  Notable for the following:    Glucose-Capillary 232 (*)    All other components within normal limits  GLUCOSE, CAPILLARY - Abnormal; Notable for the following:    Glucose-Capillary 246 (*)    All other components within normal limits  GLUCOSE, CAPILLARY - Abnormal; Notable for the following:    Glucose-Capillary 248 (*)    All other components within normal limits  CULTURE, BLOOD (ROUTINE X 2)  CULTURE, BLOOD (ROUTINE X 2)  CBC WITH DIFFERENTIAL  INFLUENZA PANEL BY PCR (TYPE A & B, H1N1)  I-STAT CG4 LACTIC ACID, ED    Imaging Review No results found. Dg Chest Port 1 View  03/24/2014   CLINICAL DATA:  Patient is lethargic, week, short of breath with altered mental status  EXAM: PORTABLE CHEST - 1 VIEW  COMPARISON:  09/02/2012  FINDINGS: Mild cardiac enlargement. Mild to moderate vascular congestion more prominent when compared to the prior study. Discoid atelectasis right middle lobe. There is perihilar peribronchial wall thickening. No evidence of interstitial or alveolar pulmonary edema.  IMPRESSION: Stable cardiac enlargement with increased vascular congestion and bilateral bronchial wall thickening, findings which may suggest developing minimal pulmonary edema.   Electronically Signed   By: Esperanza Heir M.D.   On: 03/24/2014 17:36   EKG Interpretation None      MDM    Final diagnoses:  COPD with acute exacerbation  Hypoxia  Sepsis, due  to unspecified organism   The patient appears reasonably stabilized for admission considering the current resources, flow, and capabilities available in the ED at this time, and I doubt any other [facility] requiring further screening and/or treatment in the ED prior to admission.  I personally performed the services described in this documentation, which was scribed in my presence. The recorded information has been reviewed and is accurate.  Hurman Horn, MD 03/26/14 [phone]

## 2014-03-24 NOTE — ED Notes (Addendum)
Had flu shot yesterday and running temp,. Today.  Denies pain.  ST 110. Had flu shot yesterday.  When rescue arrival at house, pts SATs were 76%.  Placed on NRB via EMS and SATs increased to 96%.  Placed on o2@4l /m n/c during ride to ER, SATs 94% .  EMS gave neb and 1 mg of narcan.  EMT states pupils were pin point on arrival but are currently reactive.  Glucose in EMS was 168 and #18g jelco to left wrist placed.

## 2014-03-25 DIAGNOSIS — I10 Essential (primary) hypertension: Secondary | ICD-10-CM

## 2014-03-25 DIAGNOSIS — J441 Chronic obstructive pulmonary disease with (acute) exacerbation: Principal | ICD-10-CM

## 2014-03-25 DIAGNOSIS — E118 Type 2 diabetes mellitus with unspecified complications: Secondary | ICD-10-CM

## 2014-03-25 DIAGNOSIS — J9601 Acute respiratory failure with hypoxia: Secondary | ICD-10-CM

## 2014-03-25 LAB — BASIC METABOLIC PANEL
Anion gap: 13 (ref 5–15)
BUN: 22 mg/dL (ref 6–23)
CHLORIDE: 99 meq/L (ref 96–112)
CO2: 26 mEq/L (ref 19–32)
CREATININE: 0.93 mg/dL (ref 0.50–1.10)
Calcium: 8.2 mg/dL — ABNORMAL LOW (ref 8.4–10.5)
GFR calc Af Amer: 78 mL/min — ABNORMAL LOW (ref >90)
GFR calc non Af Amer: 67 mL/min — ABNORMAL LOW (ref >90)
Glucose, Bld: 238 mg/dL — ABNORMAL HIGH (ref 70–99)
Potassium: 3.9 mEq/L (ref 3.7–5.3)
Sodium: 138 mEq/L (ref 137–147)

## 2014-03-25 LAB — INFLUENZA PANEL BY PCR (TYPE A & B)
H1N1FLUPCR: NOT DETECTED
Influenza A By PCR: NEGATIVE
Influenza B By PCR: NEGATIVE

## 2014-03-25 LAB — CBC
HEMATOCRIT: 36.6 % (ref 36.0–46.0)
Hemoglobin: 11.9 g/dL — ABNORMAL LOW (ref 12.0–15.0)
MCH: 31.2 pg (ref 26.0–34.0)
MCHC: 32.5 g/dL (ref 30.0–36.0)
MCV: 95.8 fL (ref 78.0–100.0)
Platelets: 248 10*3/uL (ref 150–400)
RBC: 3.82 MIL/uL — ABNORMAL LOW (ref 3.87–5.11)
RDW: 13.5 % (ref 11.5–15.5)
WBC: 7.2 10*3/uL (ref 4.0–10.5)

## 2014-03-25 LAB — GLUCOSE, CAPILLARY
GLUCOSE-CAPILLARY: 184 mg/dL — AB (ref 70–99)
Glucose-Capillary: 198 mg/dL — ABNORMAL HIGH (ref 70–99)
Glucose-Capillary: 232 mg/dL — ABNORMAL HIGH (ref 70–99)
Glucose-Capillary: 278 mg/dL — ABNORMAL HIGH (ref 70–99)

## 2014-03-25 MED ORDER — TRAMADOL HCL 50 MG PO TABS
50.0000 mg | ORAL_TABLET | Freq: Once | ORAL | Status: AC
  Administered 2014-03-25: 50 mg via ORAL
  Filled 2014-03-25: qty 1

## 2014-03-25 MED ORDER — IPRATROPIUM-ALBUTEROL 0.5-2.5 (3) MG/3ML IN SOLN
3.0000 mL | RESPIRATORY_TRACT | Status: DC | PRN
  Administered 2014-03-25: 3 mL via RESPIRATORY_TRACT

## 2014-03-25 MED ORDER — IPRATROPIUM-ALBUTEROL 0.5-2.5 (3) MG/3ML IN SOLN
3.0000 mL | Freq: Four times a day (QID) | RESPIRATORY_TRACT | Status: DC
  Administered 2014-03-25: 3 mL via RESPIRATORY_TRACT
  Filled 2014-03-25: qty 3

## 2014-03-25 MED ORDER — IPRATROPIUM-ALBUTEROL 0.5-2.5 (3) MG/3ML IN SOLN
3.0000 mL | RESPIRATORY_TRACT | Status: DC
  Administered 2014-03-25 – 2014-03-27 (×13): 3 mL via RESPIRATORY_TRACT
  Filled 2014-03-25 (×14): qty 3

## 2014-03-25 MED ORDER — METHYLPREDNISOLONE SODIUM SUCC 125 MG IJ SOLR
60.0000 mg | Freq: Four times a day (QID) | INTRAMUSCULAR | Status: DC
  Administered 2014-03-25 – 2014-03-28 (×12): 60 mg via INTRAVENOUS
  Filled 2014-03-25 (×12): qty 2

## 2014-03-25 NOTE — Progress Notes (Signed)
Utilization review Completed Odean Mcelwain RN BSN   

## 2014-03-25 NOTE — Progress Notes (Signed)
[facility] PROGRESS NOTE  Gail Preston ZOX:[id number] DOB: [birth year] DOA: 03/24/2014 PCP: Rudi Heap, MD  Assessment/Plan: #1 COPD with acute exacerbation  - On scheduled bronchodilators - Cont scheduled IV steroids - Currently remains on 5LNC. Wean o2 as tolerated  #2 fevers  - Unclear etiology with no leukocytosis and no neutrophilia - Currently on empiric Levofloxacin due to her COPD exacerbation.   #3 acute renal insufficiency  - Suspected secondary to dehydration. We'll fluid replace.   #4 diabetes  - Continue home medications cover with sliding scale.  - CBGs pre-meal and at bedtime   #5 hypertension  - continue antihypertensives.   #6 anxiety  - continue with Klonopin   Code Status: Full Family Communication: Pt in room Disposition Plan: Pending   Consultants:  none  Procedures:    Antibiotics:  Levofloxacin   HPI/Subjective: No complaints. Still sob  Objective: Filed Vitals:   03/25/14 0204 03/25/14 0508 03/25/14 0803 03/25/14 0858  BP:  116/48  105/41  Pulse:  95  75  Temp:  98.3 F (36.8 C)  98 F (36.7 C)  TempSrc:  Oral  Oral  Resp:  19    Height:      Weight:      SpO2: 91% 79% 90% 93%   No intake or output data in the 24 hours ending 03/25/14 1039 Filed Weights   03/24/14 1622 03/24/14 1902  Weight: 122.29 kg (269 lb 9.6 oz) 124.7 kg (274 lb 14.6 oz)    Exam:   General:  Awake, in nad  Cardiovascular: regular, s1, s23  Respiratory: slightly increased resp effort  Abdomen: soft,nondistended  Musculoskeletal: perfused, no clubbing   Data Reviewed: Basic Metabolic Panel:  Recent Labs Lab 03/24/14 1652 03/25/14 0555  NA 139 138  K 4.1 3.9  CL 97 99  CO2 29 26  GLUCOSE 117* 238*  BUN 22 22  CREATININE 1.36* 0.93  CALCIUM 9.6 8.2*   Liver Function Tests:  Recent Labs Lab 03/24/14 1652  AST 36  ALT 46*  ALKPHOS 65  BILITOT 0.4  PROT 7.2  ALBUMIN 3.5   No results found for this basename:  LIPASE, AMYLASE,  in the last 168 hours No results found for this basename: AMMONIA,  in the last 168 hours CBC:  Recent Labs Lab 03/24/14 1652 03/25/14 0555  WBC 6.9 7.2  NEUTROABS 4.4  --   HGB 12.4 11.9*  HCT 37.7 36.6  MCV 96.7 95.8  PLT 258 248   Cardiac Enzymes: No results found for this basename: CKTOTAL, CKMB, CKMBINDEX, TROPONINI,  in the last 168 hours BNP (last 3 results) No results found for this basename: PROBNP,  in the last 8760 hours CBG:  Recent Labs Lab 03/24/14 2104 03/25/14 0744  GLUCAP 217* 198*    Recent Results (from the past 240 hour(s))  CULTURE, BLOOD (ROUTINE X 2)     Status: None   Collection Time    03/24/14  4:52 PM      Result Value Ref Range Status   Specimen Description RIGHT ANTECUBITAL   Final   Special Requests BOTTLES DRAWN AEROBIC AND ANAEROBIC 8CC   Final   Culture NO GROWTH 1 DAY   Final   Report Status PENDING   Incomplete  CULTURE, BLOOD (ROUTINE X 2)     Status: None   Collection Time    03/24/14  4:53 PM      Result Value Ref Range Status   Specimen Description LEFT ANTECUBITAL   Final  Special Requests BOTTLES DRAWN AEROBIC ONLY 10CC   Final   Culture NO GROWTH 1 DAY   Final   Report Status PENDING   Incomplete     Studies: Dg Chest Port 1 View  03/24/2014   CLINICAL DATA:  Patient is lethargic, week, short of breath with altered mental status  EXAM: PORTABLE CHEST - 1 VIEW  COMPARISON:  09/02/2012  FINDINGS: Mild cardiac enlargement. Mild to moderate vascular congestion more prominent when compared to the prior study. Discoid atelectasis right middle lobe. There is perihilar peribronchial wall thickening. No evidence of interstitial or alveolar pulmonary edema.  IMPRESSION: Stable cardiac enlargement with increased vascular congestion and bilateral bronchial wall thickening, findings which may suggest developing minimal pulmonary edema.   Electronically Signed   By: Esperanza Heiraymond  Rubner M.D.   On: 03/24/2014 17:36     Scheduled Meds: . atorvastatin  40 mg Oral Daily  . benazepril  20 mg Oral Daily   And  . hydrochlorothiazide  25 mg Oral Daily  . carvedilol  25 mg Oral BID WC  . cloNIDine  0.3 mg Oral BID  . enoxaparin (LOVENOX) injection  40 mg Subcutaneous Q24H  . gabapentin  300 mg Oral TID  . guaiFENesin  600 mg Oral BID  . insulin aspart  0-20 Units Subcutaneous TID WC  . insulin glargine  40 Units Subcutaneous QHS  . ipratropium-albuterol  3 mL Nebulization Q6H  . levofloxacin (LEVAQUIN) IV  500 mg Intravenous Q24H  . metFORMIN  500 mg Oral BID WC  . methylPREDNISolone (SOLU-MEDROL) injection  60 mg Intravenous Q12H   Continuous Infusions: . sodium chloride 1,000 mL (03/25/14 0352)    Active Problems:   COPD with acute exacerbation   Fever presenting with conditions classified elsewhere   Acute respiratory failure   Acute renal insufficiency  Time spent: 35min  Sukaina Toothaker K  [facility] Pager [phone]. If 7PM-7AM, please contact night-coverage at [email or web address], password [email or web address] 03/25/2014, 10:39 AM  LOS: 1 day

## 2014-03-26 DIAGNOSIS — N289 Disorder of kidney and ureter, unspecified: Secondary | ICD-10-CM

## 2014-03-26 LAB — GLUCOSE, CAPILLARY
GLUCOSE-CAPILLARY: 278 mg/dL — AB (ref 70–99)
Glucose-Capillary: 232 mg/dL — ABNORMAL HIGH (ref 70–99)
Glucose-Capillary: 246 mg/dL — ABNORMAL HIGH (ref 70–99)
Glucose-Capillary: 248 mg/dL — ABNORMAL HIGH (ref 70–99)

## 2014-03-26 MED ORDER — LEVOFLOXACIN 500 MG PO TABS
500.0000 mg | ORAL_TABLET | Freq: Every day | ORAL | Status: DC
  Administered 2014-03-26 – 2014-03-27 (×2): 500 mg via ORAL
  Filled 2014-03-26 (×2): qty 1

## 2014-03-26 MED ORDER — INSULIN GLARGINE 100 UNIT/ML ~~LOC~~ SOLN
45.0000 [IU] | Freq: Every day | SUBCUTANEOUS | Status: DC
  Administered 2014-03-26: 45 [IU] via SUBCUTANEOUS
  Filled 2014-03-26: qty 0.45

## 2014-03-26 NOTE — Progress Notes (Signed)
PHARMACIST - PHYSICIAN COMMUNICATION DR:   Chiu CONCERNING: Antibiotic IV to Oral Route Change Policy  RECOMMENDATION: This patient is receiving Levaquin by the intravenous route.  Based on criteria approved by the Pharmacy and Therapeutics Committee, the antibiotic(s) is/are being converted to the equivalent oral dose form(s).   DESCRIPTION: These criteria include:  Patient being treated for a respiratory tract infection, urinary tract infection, cellulitis or clostridium difficile associated diarrhea if on metronidazole  The patient is not neutropenic and does not exhibit a GI malabsorption state  The patient is eating (either orally or via tube) and/or has been taking other orally administered medications for a least 24 hours  The patient is improving clinically and has a Tmax < 100.5  If you have questions about this conversion, please contact the Pharmacy Department  [x]  ( [phone] )  [place] []  ( [phone] )  [facility]  []  ( [phone] )  [facility] []  ( [phone] )  [facility]   S. Mariko Nowakowski, PharmD  

## 2014-03-26 NOTE — Care Management Note (Addendum)
    Page 1 of 1   03/28/2014     12:08:38 PM CARE MANAGEMENT NOTE 03/28/2014  Patient:  Gail Preston,Gail   Account Number:  [id number]  Date Initiated:  03/26/2014  Documentation initiated by:  Sharrie RothmanBLACKWELL,TAMMY C  Subjective/Objective Assessment:   Pt admitted from home with COPD exacerbation. Pt lives alone and will return home at discharge. Pt is independent with ADL's. [facility]     Action/Plan:   No CM needs noted.   Anticipated DC Date:  03/28/2014   Anticipated DC Plan:  HOME/SELF CARE  In-house referral  Clinical Social Worker      DC Planning Services  CM consult      [facility]  DURABLE MEDICAL EQUIPMENT   Choice offered to / List presented to:  C-1 Patient   DME arranged  NEBULIZER MACHINE      DME agency  [facility]        Status of service:  Completed, signed off Medicare Important Message given?  YES (If response is "NO", the following Medicare IM given date fields will be blank) Date Medicare IM given:  03/28/2014 Medicare IM given by:  Anibal HendersonBOLDEN,Ayen Viviano Date Additional Medicare IM given:   Additional Medicare IM given by:    Discharge Disposition:  HOME/SELF CARE  Per UR Regulation:  Reviewed for med. necessity/level of care/duration of stay  If discussed at Long Length of Stay Meetings, dates discussed:    Comments:  03/28/14 1200 Anibal HendersonGeneva Isebella Upshur RN/CM Referred to CSW for transportation- no way to get home from hospital 03/26/14 1150 Arlyss Queenammy Blackwell, RN BSN CM

## 2014-03-26 NOTE — Progress Notes (Signed)
Inpatient Diabetes Program Recommendations  AACE/ADA: New Consensus Statement on Inpatient Glycemic Control (2013)  Target Ranges:  Prepandial:   less than 140 mg/dL      Peak postprandial:   less than 180 mg/dL (1-2 hours)      Critically ill patients:  140 - 180 mg/dL   Results for Gail Preston, Gail (MRN [id number]) as of 03/26/2014 08:36  Ref. Range 03/25/2014 07:44 03/25/2014 11:28 03/25/2014 16:48 03/25/2014 20:24 03/26/2014 07:43  Glucose-Capillary Latest Range: 70-99 mg/dL 119198 (H) 147184 (H) 829232 (H) 278 (H) 232 (H)    Diabetes history: DM2 Outpatient Diabetes medications: Toujeo 40 units QHS, Metformin 500 mg BID, Novolog 5 units (if CBG > 250 mg/dl) Current orders for Inpatient glycemic control: Lantus 40 units QHS, Novolog 0-20 units HS, Metformin 500 mg BID  Inpatient Diabetes Program Recommendations Insulin - Basal: If steroids are continued, please consider increasing Lantus to 43 units QHS. Insulin-Correction: Please consider ordering Novolog bedtime correction scale. Insulin - Meal Coverage: If steroids are continued, please consider ordering Novolog 4 units TID with meals.  Thanks, Orlando PennerMarie Mikinzie Maciejewski, RN, MSN, CCRN Diabetes Coordinator Inpatient Diabetes Program [phone] (Team Pager) [phone] ([place] office) [phone] [facility] office)

## 2014-03-26 NOTE — Progress Notes (Signed)
[facility] PROGRESS NOTE  Gail Preston GEX:[id number] DOB: Jun 21, 1956 DOA: 03/24/2014 PCP: Rudi Heap, MD  Assessment/Plan: #1 COPD with acute exacerbation  - On scheduled bronchodilators - Cont scheduled IV steroids - Currently down to [facility]. Cont to wean o2 as tolerated - Flu neg  #2 fevers  - Unclear etiology with no leukocytosis and no neutrophilia - Currently on empiric Levofloxacin due to her COPD exacerbation.   #3 acute renal insufficiency  - Suspected secondary to dehydration - Improved - Cont IVF.   #4 diabetes 2 - Continue home medications cover with sliding scale.  - CBGs pre-meal and at bedtime  - Glucose in the mid-200's while on steroids - Will increase lantus to 45 units  #5 hypertension  - continue antihypertensives.  - BP suboptimal, but pt is on high dosed steroids - monitor for now  #6 anxiety  - continue with Klonopin   Code Status: Full Family Communication: Pt in room Disposition Plan: Pending   Consultants:  none  Procedures:    Antibiotics:  Levofloxacin 10/4>>>  HPI/Subjective: Feels better today. Still wheezing. Eager to go home  Objective: Filed Vitals:   03/26/14 0500 03/26/14 0652 03/26/14 0852 03/26/14 1101  BP: 155/72  168/77   Pulse: 82  86   Temp: 97.9 F (36.6 C)     TempSrc: Axillary     Resp: 18     Height:      Weight: 126 kg (277 lb 12.5 oz)     SpO2: 98% 95% 94% 91%    Intake/Output Summary (Last 24 hours) at 03/26/14 1207 Last data filed at 03/25/14 1800  Gross per 24 hour  Intake   2005 ml  Output      0 ml  Net   2005 ml   Filed Weights   03/24/14 1902 03/25/14 2300 03/26/14 0500  Weight: 124.7 kg (274 lb 14.6 oz) 124.8 kg (275 lb 2.2 oz) 126 kg (277 lb 12.5 oz)    Exam:   General:  Awake, in nad  Cardiovascular: regular, s1, s23  Respiratory: slightly increased resp effort, wheezing throughout  Abdomen: soft,obese, nondistended  Musculoskeletal: perfused, no clubbing    Data Reviewed: Basic Metabolic Panel:  Recent Labs Lab 03/24/14 1652 03/25/14 0555  NA 139 138  K 4.1 3.9  CL 97 99  CO2 29 26  GLUCOSE 117* 238*  BUN 22 22  CREATININE 1.36* 0.93  CALCIUM 9.6 8.2*   Liver Function Tests:  Recent Labs Lab 03/24/14 1652  AST 36  ALT 46*  ALKPHOS 65  BILITOT 0.4  PROT 7.2  ALBUMIN 3.5   No results found for this basename: LIPASE, AMYLASE,  in the last 168 hours No results found for this basename: AMMONIA,  in the last 168 hours CBC:  Recent Labs Lab 03/24/14 1652 03/25/14 0555  WBC 6.9 7.2  NEUTROABS 4.4  --   HGB 12.4 11.9*  HCT 37.7 36.6  MCV 96.7 95.8  PLT 258 248   Cardiac Enzymes: No results found for this basename: CKTOTAL, CKMB, CKMBINDEX, TROPONINI,  in the last 168 hours BNP (last 3 results) No results found for this basename: PROBNP,  in the last 8760 hours CBG:  Recent Labs Lab 03/25/14 1128 03/25/14 1648 03/25/14 2024 03/26/14 0743 03/26/14 1122  GLUCAP 184* 232* 278* 232* 246*    Recent Results (from the past 240 hour(s))  CULTURE, BLOOD (ROUTINE X 2)     Status: None   Collection Time    03/24/14  4:52 PM      Result Value Ref Range Status   Specimen Description RIGHT ANTECUBITAL   Final   Special Requests BOTTLES DRAWN AEROBIC AND ANAEROBIC 8CC   Final   Culture NO GROWTH 2 DAYS   Final   Report Status PENDING   Incomplete  CULTURE, BLOOD (ROUTINE X 2)     Status: None   Collection Time    03/24/14  4:53 PM      Result Value Ref Range Status   Specimen Description LEFT ANTECUBITAL   Final   Special Requests BOTTLES DRAWN AEROBIC ONLY 10CC   Final   Culture NO GROWTH 2 DAYS   Final   Report Status PENDING   Incomplete     Studies: Dg Chest Port 1 View  03/24/2014   CLINICAL DATA:  Patient is lethargic, week, short of breath with altered mental status  EXAM: PORTABLE CHEST - 1 VIEW  COMPARISON:  09/02/2012  FINDINGS: Mild cardiac enlargement. Mild to moderate vascular congestion more  prominent when compared to the prior study. Discoid atelectasis right middle lobe. There is perihilar peribronchial wall thickening. No evidence of interstitial or alveolar pulmonary edema.  IMPRESSION: Stable cardiac enlargement with increased vascular congestion and bilateral bronchial wall thickening, findings which may suggest developing minimal pulmonary edema.   Electronically Signed   By: Esperanza Heiraymond  Rubner M.D.   On: 03/24/2014 17:36    Scheduled Meds: . atorvastatin  40 mg Oral Daily  . benazepril  20 mg Oral Daily   And  . hydrochlorothiazide  25 mg Oral Daily  . carvedilol  25 mg Oral BID WC  . cloNIDine  0.3 mg Oral BID  . enoxaparin (LOVENOX) injection  40 mg Subcutaneous Q24H  . gabapentin  300 mg Oral TID  . guaiFENesin  600 mg Oral BID  . insulin aspart  0-20 Units Subcutaneous TID WC  . insulin glargine  45 Units Subcutaneous QHS  . ipratropium-albuterol  3 mL Nebulization Q4H  . levofloxacin (LEVAQUIN) IV  500 mg Intravenous Q24H  . metFORMIN  500 mg Oral BID WC  . methylPREDNISolone (SOLU-MEDROL) injection  60 mg Intravenous Q6H   Continuous Infusions: . sodium chloride 1,000 mL (03/26/14 0502)    Active Problems:   COPD with acute exacerbation   Fever presenting with conditions classified elsewhere   Acute respiratory failure   Acute renal insufficiency  Time spent: 35min  Davide Risdon K  [facility] Pager [phone]. If 7PM-7AM, please contact night-coverage at [email or web address], password [email or web address] 03/26/2014, 12:07 PM  LOS: 2 days

## 2014-03-27 LAB — GLUCOSE, CAPILLARY
Glucose-Capillary: 234 mg/dL — ABNORMAL HIGH (ref 70–99)
Glucose-Capillary: 276 mg/dL — ABNORMAL HIGH (ref 70–99)
Glucose-Capillary: 231 mg/dL — ABNORMAL HIGH (ref 70–99)
Glucose-Capillary: 226 mg/dL — ABNORMAL HIGH (ref 70–99)

## 2014-03-27 MED ORDER — CYCLOBENZAPRINE HCL 10 MG PO TABS
5.0000 mg | ORAL_TABLET | Freq: Three times a day (TID) | ORAL | Status: DC | PRN
  Administered 2014-03-27 – 2014-03-28 (×2): 5 mg via ORAL
  Filled 2014-03-27 (×2): qty 1

## 2014-03-27 MED ORDER — IPRATROPIUM-ALBUTEROL 0.5-2.5 (3) MG/3ML IN SOLN
3.0000 mL | Freq: Three times a day (TID) | RESPIRATORY_TRACT | Status: DC
Start: 2014-03-28 — End: 2014-03-28
  Administered 2014-03-28: 3 mL via RESPIRATORY_TRACT
  Filled 2014-03-27: qty 3

## 2014-03-27 MED ORDER — INSULIN GLARGINE 100 UNIT/ML ~~LOC~~ SOLN
50.0000 [IU] | Freq: Every day | SUBCUTANEOUS | Status: DC
  Administered 2014-03-27: 50 [IU] via SUBCUTANEOUS
  Filled 2014-03-27 (×2): qty 0.5

## 2014-03-27 MED ORDER — MORPHINE SULFATE 2 MG/ML IJ SOLN: 2.0000 mg | INTRAMUSCULAR | Status: DC | PRN

## 2014-03-27 NOTE — Progress Notes (Addendum)
[facility] PROGRESS NOTE  Gail Preston ZOX:[id number] DOB: [birth year] DOA: 03/24/2014 PCP: Rudi Heap, MD  Off Service Summary Pt presents with COPD exacerbation. She is slowly improving with nebs and scheduled IV steroids. Clinically the patient is feeling better, however, she remains on 3L O2. The patient is on empiric levaquin given the acuity of her symptoms and for concerns of possible laryngitis. Cont to wean o2 and steroids as tolerated.  Assessment/Plan: #1 COPD with acute exacerbation  - On scheduled bronchodilators - Cont scheduled IV steroids - Currently at [facility] from 5L. Cont to wean o2 as tolerated - Flu neg  #2 fevers possibly secondary to laryngitis - Unclear etiology with no leukocytosis and no neutrophilia - Pt does have a hoarse voice, improving with abx - Currently on empiric Levofloxacin due to her COPD exacerbation.   #3 acute renal insufficiency  - Suspected secondary to dehydration - Improved - Cont IVF.   #4 diabetes 2 - Continue home medications cover with sliding scale.  - CBGs pre-meal and at bedtime  - Glucose in the mid-200's while on steroids - Will increase lantus to 50 units (home dose 40units)  #5 hypertension  - continue antihypertensives.  - BP suboptimal, but pt is on high dosed steroids - monitor for now  #6 anxiety  - continue with Klonopin   Code Status: Full Family Communication: Pt in room Disposition Plan: Home when off O2   Consultants:  none  Procedures:    Antibiotics:  Levofloxacin 10/4>>>  HPI/Subjective: Continuing to feel better. Remains eager to go home  Objective: Filed Vitals:   03/27/14 0007 03/27/14 0445 03/27/14 0533 03/27/14 0650  BP:   151/66   Pulse:   68   Temp:   97.8 F (36.6 C)   TempSrc:   Oral   Resp:   18   Height:      Weight:      SpO2: 94% 95% 97% 97%    Intake/Output Summary (Last 24 hours) at 03/27/14 1006 Last data filed at 03/27/14 0800  Gross per 24 hour  Intake    1480 ml  Output    600 ml  Net    880 ml   Filed Weights   03/24/14 1902 03/25/14 2300 03/26/14 0500  Weight: 124.7 kg (274 lb 14.6 oz) 124.8 kg (275 lb 2.2 oz) 126 kg (277 lb 12.5 oz)    Exam:   General:  Awake, in nad  Cardiovascular: regular, s1, s23  Respiratory: normal resp effort, end-expiratory wheezing, improved  Abdomen: soft,obese, nondistended  Musculoskeletal: perfused, no clubbing   Data Reviewed: Basic Metabolic Panel:  Recent Labs Lab 03/24/14 1652 03/25/14 0555  NA 139 138  K 4.1 3.9  CL 97 99  CO2 29 26  GLUCOSE 117* 238*  BUN 22 22  CREATININE 1.36* 0.93  CALCIUM 9.6 8.2*   Liver Function Tests:  Recent Labs Lab 03/24/14 1652  AST 36  ALT 46*  ALKPHOS 65  BILITOT 0.4  PROT 7.2  ALBUMIN 3.5   No results found for this basename: LIPASE, AMYLASE,  in the last 168 hours No results found for this basename: AMMONIA,  in the last 168 hours CBC:  Recent Labs Lab 03/24/14 1652 03/25/14 0555  WBC 6.9 7.2  NEUTROABS 4.4  --   HGB 12.4 11.9*  HCT 37.7 36.6  MCV 96.7 95.8  PLT 258 248   Cardiac Enzymes: No results found for this basename: CKTOTAL, CKMB, CKMBINDEX, TROPONINI,  in the last 168  hours BNP (last 3 results) No results found for this basename: PROBNP,  in the last 8760 hours CBG:  Recent Labs Lab 03/26/14 0743 03/26/14 1122 03/26/14 1633 03/26/14 2107 03/27/14 0717  GLUCAP 232* 246* 248* 278* 234*    Recent Results (from the past 240 hour(s))  CULTURE, BLOOD (ROUTINE X 2)     Status: None   Collection Time    03/24/14  4:52 PM      Result Value Ref Range Status   Specimen Description RIGHT ANTECUBITAL   Final   Special Requests BOTTLES DRAWN AEROBIC AND ANAEROBIC 8CC   Final   Culture NO GROWTH 2 DAYS   Final   Report Status PENDING   Incomplete  CULTURE, BLOOD (ROUTINE X 2)     Status: None   Collection Time    03/24/14  4:53 PM      Result Value Ref Range Status   Specimen Description LEFT ANTECUBITAL    Final   Special Requests BOTTLES DRAWN AEROBIC ONLY 10CC   Final   Culture NO GROWTH 2 DAYS   Final   Report Status PENDING   Incomplete     Studies: No results found.  Scheduled Meds: . atorvastatin  40 mg Oral Daily  . benazepril  20 mg Oral Daily   And  . hydrochlorothiazide  25 mg Oral Daily  . carvedilol  25 mg Oral BID WC  . cloNIDine  0.3 mg Oral BID  . enoxaparin (LOVENOX) injection  40 mg Subcutaneous Q24H  . gabapentin  300 mg Oral TID  . guaiFENesin  600 mg Oral BID  . insulin aspart  0-20 Units Subcutaneous TID WC  . insulin glargine  45 Units Subcutaneous QHS  . ipratropium-albuterol  3 mL Nebulization Q4H  . levofloxacin  500 mg Oral q1800  . metFORMIN  500 mg Oral BID WC  . methylPREDNISolone (SOLU-MEDROL) injection  60 mg Intravenous Q6H   Continuous Infusions: . sodium chloride 1,000 mL (03/27/14 0540)    Active Problems:   COPD with acute exacerbation   Fever presenting with conditions classified elsewhere   Acute respiratory failure   Acute renal insufficiency  Time spent: 35min  Geremy Rister K  [facility] Pager [phone]. If 7PM-7AM, please contact night-coverage at [email or web address], password [email or web address] 03/27/2014, 10:06 AM  LOS: 3 days

## 2014-03-27 NOTE — Progress Notes (Signed)
Pt. Complaining of 10/10 chronic back pain and spasms.  Notified MD.  Will continue to monitor patient.

## 2014-03-27 NOTE — Progress Notes (Signed)
Inpatient Diabetes Program Recommendations  AACE/ADA: New Consensus Statement on Inpatient Glycemic Control (2013)  Target Ranges:  Prepandial:   less than 140 mg/dL      Peak postprandial:   less than 180 mg/dL (1-2 hours)      Critically ill patients:  140 - 180 mg/dL   Results for Gail Preston, [name] (MRN [id number]) as of 03/27/2014 09:32  Ref. Range 03/26/2014 07:43 03/26/2014 11:22 03/26/2014 16:33 03/26/2014 21:07 03/27/2014 07:17  Glucose-Capillary Latest Range: 70-99 mg/dL 409232 (H) 811246 (H) 914248 (H) 278 (H) 234 (H)   Diabetes history: DM2  Outpatient Diabetes medications: Toujeo 40 units QHS, Metformin 500 mg BID, Novolog 5 units (if CBG > 250 mg/dl)  Current orders for Inpatient glycemic control: Lantus 45 units QHS, Novolog 0-20 units HS, Metformin 500 mg BID  Inpatient Diabetes Program Recommendations Insulin - Basal:Noted Lantus was increased to 45 units on 10/5.  Insulin - Meal Coverage: If steroids are continued, please consider ordering Novolog 4 units TID with meals. Insulin-Correction: Please consider ordering Novolog bedtime correction scale.  Thanks, Orlando PennerMarie Sanaa Zilberman, RN, MSN, CCRN Diabetes Coordinator Inpatient Diabetes Program [phone] (Team Pager) [phone] ([place] office) [phone] [facility] office)

## 2014-03-28 DIAGNOSIS — F419 Anxiety disorder, unspecified: Secondary | ICD-10-CM

## 2014-03-28 LAB — GLUCOSE, CAPILLARY
Glucose-Capillary: 175 mg/dL — ABNORMAL HIGH (ref 70–99)
Glucose-Capillary: 220 mg/dL — ABNORMAL HIGH (ref 70–99)

## 2014-03-28 MED ORDER — LEVOFLOXACIN 500 MG PO TABS: 500.0000 mg | ORAL_TABLET | Freq: Every day | ORAL | Status: DC

## 2014-03-28 MED ORDER — GUAIFENESIN ER 600 MG PO TB12: 600.0000 mg | ORAL_TABLET | Freq: Two times a day (BID) | ORAL | Status: DC

## 2014-03-28 MED ORDER — TIOTROPIUM BROMIDE MONOHYDRATE 18 MCG IN CAPS: 18.0000 ug | ORAL_CAPSULE | Freq: Every day | RESPIRATORY_TRACT | Status: DC

## 2014-03-28 MED ORDER — PREDNISONE (PAK) 10 MG PO TABS: ORAL_TABLET | Freq: Every day | ORAL | Status: DC

## 2014-03-28 MED ORDER — IPRATROPIUM-ALBUTEROL 0.5-2.5 (3) MG/3ML IN SOLN: 3.0000 mL | RESPIRATORY_TRACT | Status: AC | PRN

## 2014-03-28 MED ORDER — INSULIN GLARGINE 100 UNIT/ML ~~LOC~~ SOLN: 50.0000 [IU] | Freq: Every day | SUBCUTANEOUS | Status: DC

## 2014-03-28 NOTE — Discharge Summary (Signed)
Physician Discharge Summary  Gail Preston ZOX:[id number] DOB: 01/14/1956 DOA: 03/24/2014  PCP: Rudi Heap, MD  Admit date: 03/24/2014 Discharge date: 03/28/2014  Time spent: 45 minutes  Recommendations for Outpatient Follow-up:  Patient will be discharged to home. She is to followup with her primary care physician within one week of discharge. Patient to continue taking her medications as prescribed. She was strongly urged to stop smoking. Patient should resume a heart healthy/carb modified diet and continue physical activity as tolerated.  Discharge Diagnoses:  COPD with acute exacerbation Fevers possibly secondary laryngitis Acute renal insufficiency Diabetes mellitus, type II Hypertension Anxiety Tobacco abuse  Discharge Condition: Stable  Diet recommendation: Heart healthy/carb modified  Filed Weights   03/24/14 1902 03/25/14 2300 03/26/14 0500  Weight: 124.7 kg (274 lb 14.6 oz) 124.8 kg (275 lb 2.2 oz) 126 kg (277 lb 12.5 oz)    History of present illness:  On 03/24/2014 Gail Preston is a 58 y.o. female with a history of diabetes, asthma, COPD, hypertension, anxiety who was brought to the emergency department by EMS. The patient has been tired and weak over the past 48 hours. She went her primary care physician's office yesterday and was evaluated. She received a flu vaccine. She states that she had felt like she was having a cold prior to being seen, but that her symptoms worsened after being seen at her PCPs office. When EMS arrived at her residence, code sepsis was called due to her hypersomnolence. She receives her of liters of fluid in the emergency carbonate was started on empiric antibiotics. Additionally, she has a cough that is nonproductive, but loose and rattly. She also admitted to wheezing which had been increasing since her illness began. No PA or provoking factors.  Hospital Course:  COPD with acute exacerbation  - On scheduled bronchodilators  - Will  discharge patient on prednisone taper, PO levaquin, nebulizer with treatments, and bronchdilators - Patient required 5L of [place] upon admission and was successfully tapered to room air - Flu neg  - Possibly secondary to continued smoking  Fevers possibly secondary to laryngitis  - Unclear etiology with no leukocytosis and no neutrophilia  - Patient does have a hoarse voice, however improving with antibiotics - Currently on empiric Levofloxacin due to her COPD exacerbation.   Acute renal insufficiency  - Resolved, Suspected secondary to dehydration  - Initially placed on IVF  Diabetes Mellitus, Type 2 - Continue home medications   - Glucose in the mid-200's while on steroids  - Will increase lantus to 50 units (home dose 40units) due to steroids - Patient should follow up with her PCP   Hypertension  - continue antihypertensives.   Anxiety  - continue with Klonopin   Tobacco abuse - patient counseled on smoking cessation  Hyperlipidemia - Continue statin  Procedures: None  Consultations: None  Discharge Exam: Filed Vitals:   03/28/14 0720  BP: 176/72  Pulse: 66  Temp: 98 F (36.7 C)  Resp: 18     General: Well developed, well nourished, NAD, appears stated age  HEENT: NCAT, mucous membranes moist.  Cardiovascular: S1 S2 auscultated, no rubs, murmurs or gallops. Regular rate and rhythm.  Respiratory: End expiratory wheezing, otherwise clear lung sounds, normal inspiratory effort  Abdomen: Soft, obese, nontender, nondistended, + bowel sounds  Extremities: warm dry without cyanosis clubbing or edema  Neuro: AAOx3, cranial nerves grossly intact. Strength 5/5 in patient's upper and lower extremities bilaterally  Discharge Instructions      Discharge Instructions  DME Nebulizer machine    Complete by:  As directed      Discharge instructions    Complete by:  As directed   Patient will be discharged to home. She is to followup with her primary care  physician within one week of discharge. Patient to continue taking her medications as prescribed. She was strongly urged to stop smoking. Patient should resume a heart healthy/carb modified diet and continue physical activity as tolerated.            Medication List    STOP taking these medications       Insulin Glargine 300 UNIT/ML Sopn  Commonly known as:  TOUJEO SOLOSTAR  Replaced by:  insulin glargine 100 UNIT/ML injection      TAKE these medications       atorvastatin 40 MG tablet  Commonly known as:  LIPITOR  Take 40 mg by mouth daily.     benazepril-hydrochlorthiazide 20-25 MG per tablet  Commonly known as:  LOTENSIN HCT  Take 1 tablet by mouth daily.     carvedilol 25 MG tablet  Commonly known as:  COREG  Take 1 tablet (25 mg total) by mouth 2 (two) times daily with a meal.     clonazePAM 2 MG tablet  Commonly known as:  KLONOPIN  Take 1 tablet (2 mg total) by mouth 3 (three) times daily as needed.     cloNIDine 0.3 MG tablet  Commonly known as:  CATAPRES  Take 1 tablet (0.3 mg total) by mouth 2 (two) times daily.     gabapentin 300 MG capsule  Commonly known as:  NEURONTIN  Take 300 mg by mouth 2 (two) times daily as needed (Nerve Pain).     guaiFENesin 600 MG 12 hr tablet  Commonly known as:  MUCINEX  Take 1 tablet (600 mg total) by mouth 2 (two) times daily.     insulin aspart 100 UNIT/ML injection  Commonly known as:  novoLOG  Inject 5 Units into the skin as needed. Inject 5 unit as needed for BG great than 250.  Call office for insulin adjustment is needed more than 2 times per week     insulin glargine 100 UNIT/ML injection  Commonly known as:  LANTUS  Inject 0.5 mLs (50 Units total) into the skin at bedtime.     ipratropium-albuterol 0.5-2.5 (3) MG/3ML Soln  Commonly known as:  DUONEB  Take 3 mLs by nebulization every 2 (two) hours as needed.     levofloxacin 500 MG tablet  Commonly known as:  LEVAQUIN  Take 1 tablet (500 mg total) by mouth  daily at 6 PM.     metFORMIN 500 MG 24 hr tablet  Commonly known as:  GLUCOPHAGE-XR  Take 1 tablet (500 mg total) by mouth 2 (two) times daily.     predniSONE 10 MG tablet  Commonly known as:  STERAPRED UNI-PAK  - Take by mouth daily. Prednisone dosing: Take  Prednisone 40mg  (4 tabs) x 3 days, then taper to 30mg  (3 tabs) x 3 days, then 20mg  (2 tabs) x 3days, then 10mg  (1 tab) x 3days, then OFF.  -   - Dispense:  30 tabs, refills: None     tiotropium 18 MCG inhalation capsule  Commonly known as:  SPIRIVA HANDIHALER  Place 1 capsule (18 mcg total) into inhaler and inhale daily.       Allergies  Allergen Reactions  . Nsaids Anaphylaxis  . Penicillins Anaphylaxis   Follow-up Information   Follow  up with Rudi Heap, MD. Schedule an appointment as soon as possible for a visit in 1 week. [facility] followup)    Specialty:  Family Medicine   Contact information:   [address] [place] [address] [phone]        The results of significant diagnostics from this hospitalization (including imaging, microbiology, ancillary and laboratory) are listed below for reference.    Significant Diagnostic Studies: Dg Chest Port 1 View  03/24/2014   CLINICAL DATA:  Patient is lethargic, week, short of breath with altered mental status  EXAM: PORTABLE CHEST - 1 VIEW  COMPARISON:  09/02/2012  FINDINGS: Mild cardiac enlargement. Mild to moderate vascular congestion more prominent when compared to the prior study. Discoid atelectasis right middle lobe. There is perihilar peribronchial wall thickening. No evidence of interstitial or alveolar pulmonary edema.  IMPRESSION: Stable cardiac enlargement with increased vascular congestion and bilateral bronchial wall thickening, findings which may suggest developing minimal pulmonary edema.   Electronically Signed   By: Esperanza Heir M.D.   On: 03/24/2014 17:36    Microbiology: Recent Results (from the past 240 hour(s))  CULTURE, BLOOD  (ROUTINE X 2)     Status: None   Collection Time    03/24/14  4:52 PM      Result Value Ref Range Status   Specimen Description BLOOD RIGHT ANTECUBITAL   Final   Special Requests BOTTLES DRAWN AEROBIC AND ANAEROBIC 8CC   Final   Culture NO GROWTH 3 DAYS   Final   Report Status PENDING   Incomplete  CULTURE, BLOOD (ROUTINE X 2)     Status: None   Collection Time    03/24/14  4:53 PM      Result Value Ref Range Status   Specimen Description BLOOD LEFT ANTECUBITAL   Final   Special Requests BOTTLES DRAWN AEROBIC ONLY 10CC   Final   Culture NO GROWTH 3 DAYS   Final   Report Status PENDING   Incomplete     Labs: Basic Metabolic Panel:  Recent Labs Lab 03/24/14 1652 03/25/14 0555  NA 139 138  K 4.1 3.9  CL 97 99  CO2 29 26  GLUCOSE 117* 238*  BUN 22 22  CREATININE 1.36* 0.93  CALCIUM 9.6 8.2*   Liver Function Tests:  Recent Labs Lab 03/24/14 1652  AST 36  ALT 46*  ALKPHOS 65  BILITOT 0.4  PROT 7.2  ALBUMIN 3.5   No results found for this basename: LIPASE, AMYLASE,  in the last 168 hours No results found for this basename: AMMONIA,  in the last 168 hours CBC:  Recent Labs Lab 03/24/14 1652 03/25/14 0555  WBC 6.9 7.2  NEUTROABS 4.4  --   HGB 12.4 11.9*  HCT 37.7 36.6  MCV 96.7 95.8  PLT 258 248   Cardiac Enzymes: No results found for this basename: CKTOTAL, CKMB, CKMBINDEX, TROPONINI,  in the last 168 hours BNP: BNP (last 3 results) No results found for this basename: PROBNP,  in the last 8760 hours CBG:  Recent Labs Lab 03/27/14 0717 03/27/14 1129 03/27/14 1625 03/27/14 2131 03/28/14 0728  GLUCAP 234* 276* 231* 226* 175*       Signed:  Hallie Ishida  [facility] 03/28/2014, 9:50 AM

## 2014-03-28 NOTE — Progress Notes (Signed)
Order to discontinue Foley catheter but patient has not had a Foley.  Patient has been voiding on her own with standby assistance while ambulating to the bathroom.

## 2014-03-28 NOTE — Discharge Instructions (Signed)

## 2014-03-28 NOTE — Progress Notes (Signed)
Patient's IV's removed and were clean, dry, and intact at removal.  Patient received discharge instructions and scripts and had no further questions or concerns.  Patient was in stable condition at discharge.  Patient was given $3.00 for [facility] transportation and [facility] was called for pickup.  Patient was transported to main entrance for [facility] pick up via wheelchair by nurse tech.

## 2014-03-29 LAB — CULTURE, BLOOD (ROUTINE X 2)
Culture: NO GROWTH
Culture: NO GROWTH

## 2014-03-30 NOTE — Progress Notes (Signed)
UR chart review completed.  

## 2014-05-09 ENCOUNTER — Telehealth: Payer: Self-pay | Admitting: Family

## 2014-05-10 ENCOUNTER — Telehealth: Payer: Self-pay | Admitting: Family

## 2014-05-10 MED ORDER — INSULIN GLARGINE 300 UNIT/ML ~~LOC~~ SOPN: 50.0000 [IU] | PEN_INJECTOR | Freq: Every day | SUBCUTANEOUS | Status: DC

## 2014-05-10 NOTE — Telephone Encounter (Signed)
Patient was taking Toujeo but it appears that it was discontinued at discharge 03/28/2014.  Her records show Lantus 50 units qd.  Ok to give her samples for Toujeo (which is same as Lantus just more concentrated).  She is to take the same as Lantus.  Toujeo 50 units daily.  Recommend follow up A1c due around beginning of December.

## 2014-05-10 NOTE — Telephone Encounter (Signed)
Patient aware that the samples are in refrigerator to be picked up and she is aware that she needs an appointment and we went ahead and scheduled for 12/7 with clinical pharmacist

## 2014-05-10 NOTE — Telephone Encounter (Signed)
Samples in refrigerator for patient

## 2014-05-10 NOTE — Telephone Encounter (Signed)
Patient states she has been out of toujeo for 3 weeks and that her BS are running over 250 and she wants to know if we can give her samples. I do not see this on her medication list

## 2014-05-11 ENCOUNTER — Encounter: Payer: Self-pay | Admitting: Physical Medicine & Rehabilitation

## 2014-05-11 ENCOUNTER — Telehealth: Payer: Self-pay | Admitting: Family Medicine

## 2014-05-11 MED ORDER — PEN NEEDLES 31G X 6 MM MISC: Status: AC

## 2014-05-11 NOTE — Telephone Encounter (Signed)
Rx sent in to pharmacy for pen needles.  Patient aware

## 2014-05-11 NOTE — Telephone Encounter (Signed)
This message was taken care of in another encounter 

## 2014-05-28 ENCOUNTER — Other Ambulatory Visit: Payer: Self-pay | Admitting: Pharmacist

## 2014-05-28 ENCOUNTER — Ambulatory Visit (INDEPENDENT_AMBULATORY_CARE_PROVIDER_SITE_OTHER): Payer: Commercial Managed Care - HMO | Admitting: Pharmacist

## 2014-05-28 ENCOUNTER — Encounter: Payer: Self-pay | Admitting: Pharmacist

## 2014-05-28 VITALS — BP 142/82 | HR 78 | Ht 66.0 in | Wt 269.0 lb

## 2014-05-28 DIAGNOSIS — I10 Essential (primary) hypertension: Secondary | ICD-10-CM

## 2014-05-28 DIAGNOSIS — E118 Type 2 diabetes mellitus with unspecified complications: Secondary | ICD-10-CM

## 2014-05-28 DIAGNOSIS — F32A Depression, unspecified: Secondary | ICD-10-CM

## 2014-05-28 DIAGNOSIS — E785 Hyperlipidemia, unspecified: Secondary | ICD-10-CM

## 2014-05-28 DIAGNOSIS — Z9181 History of falling: Secondary | ICD-10-CM

## 2014-05-28 DIAGNOSIS — R296 Repeated falls: Secondary | ICD-10-CM

## 2014-05-28 DIAGNOSIS — F419 Anxiety disorder, unspecified: Secondary | ICD-10-CM

## 2014-05-28 DIAGNOSIS — F329 Major depressive disorder, single episode, unspecified: Secondary | ICD-10-CM

## 2014-05-28 LAB — POCT GLYCOSYLATED HEMOGLOBIN (HGB A1C)

## 2014-05-28 MED ORDER — UMECLIDINIUM BROMIDE 62.5 MCG/INH IN AEPB: 1.0000 | INHALATION_SPRAY | Freq: Every day | RESPIRATORY_TRACT | Status: DC

## 2014-05-28 MED ORDER — INSULIN GLARGINE 300 UNIT/ML ~~LOC~~ SOPN: 55.0000 [IU] | PEN_INJECTOR | Freq: Every day | SUBCUTANEOUS | Status: DC

## 2014-05-28 MED ORDER — CLONAZEPAM 2 MG PO TABS: 2.0000 mg | ORAL_TABLET | Freq: Three times a day (TID) | ORAL | Status: DC | PRN

## 2014-05-28 NOTE — Progress Notes (Signed)
Patient ID: Gail Preston, female   DOB: Oct 29, 1955, 58 y.o.   MRN: [id number]   Diabetes Follow-Up Visit  HPI:  Patient referred by Gail Rodneyhristy Hawks, NP for diabetes education.  She was last seen about 2 months ago by clinical pharmacist Chari ManningMichelle Bozovich, CPP.  At that time BG was better controlled.  Patient was recently hospitalized due to COPD exacerbation.  Today she reports that she received nebulizer but no medication to use in it.  She also took spiriva for only 1 month because she did not have more refills on Rx. She reports that her breathing is much better compared to prior to hospitalization.  The only times she gets SOB is when she takes the trash out and much walk up hill.  Current medications for diabetes:  Toujeo 50 units qd;  Metformin 500mg  1 tablet BID and Novolog as needed - 5 untis when BG over 250.  Patient current using Novolog for elevated BG 2-3 times weekly Patient did not bring in glucometer but reports BG 178, 162, 256, 199 recently   Low fat/carbohydrate diet?  No Nicotine Abuse?  No Medication Compliance?  No Exercise?  No Alcohol Abuse?  No  Home BG Monitoring:  Checking 3-4 times a day.   Filed Vitals:   05/28/14 1301  BP: 142/82  Pulse: 78   Body mass index is 43.44 kg/(m^2).   Filed Weights   05/28/14 1301  Weight: 269 lb (122.018 kg)    Lab Results  Component Value Date   HGBA1C 12.0% 11/14/2013   HGBA1C 7.8% 02/22/2014   HBGA1C 8.6% 05/28/2014    No results found for this basename: Concepcion ElkMICROALBUR, [id number]    Lab Results  Component Value Date   HDL 52 02/22/2014   LDLCALC 1454 02/22/2014   TRIG 139 02/22/2014    Assessment: 1.  Diabetes - inadequate control - A1c has increased since last visit 2.  Blood Pressure.  Slightly elevated today but improved compared to last check 3.  Lipids.  Checking today 4.  COPD with recent history of acute exacerabation   Recommendations: 1.  Discussed COPD and recommendations - including d/c  smoking 2.  BP goal < 140/90.  Tried to get urine microalbumin today - patient unable to void. 3.  LDL goal of < 100, HDL > 40 and TG < 150. 4.  Eye Exam yearly and Dental Exam every 6 months. 5.  Dietary recommendations:  Reviewed CHO meal planning with patient .   6.  Physical Activity recommendations:  Chair exercises as tolerated 7.  Medication recommendations at this time are as follows:    Increase Toujeo (insulin garlgine concentrate) to 55 units QD  Change Novolog - use as needed - 3 untis for BG 200 to 249 and 5 units for BG 250 or higher.  She is again reminded to call office for insulin adjustment if she needs to use Novolog more than 2 timer per week.  Rx sent to pharmacy and patient given coupon for free 1 month supply of Incurese - 1 inhalation daily (replaces Spiriva) 8.  Return to clinic in 4-6 wks  Time spent counseling patient:  1 hour    Henrene Pastorammy Kaylor Maiers, PharmD, CPP, CDE

## 2014-05-28 NOTE — Patient Instructions (Addendum)
Increase Toujeo to 55 units once a day.  Change Novolog to the following - for blood glucose 200 to 249 give 3 units.  For blood glucose 250 or over given 5 units.   Diabetes and Standards of Medical Care   Diabetes is complicated. You may find that your diabetes team includes a dietitian, nurse, diabetes educator, eye doctor, and more. To help everyone know what is going on and to help you get the care you deserve, the following schedule of care was developed to help keep you on track. Below are the tests, exams, vaccines, medicines, education, and plans you will need.  Blood Glucose Goals Prior to meals = 80 - 130 Within 2 hours of the start of a meal = less than 180  HbA1c test (goal is less than 7.0% - your last value was 8.6%) This test shows how well you have controlled your glucose over the past 2 to 3 months. It is used to see if your diabetes management plan needs to be adjusted.   It is performed at least 2 times a year if you are meeting treatment goals.  It is performed 4 times a year if therapy has changed or if you are not meeting treatment goals.  Blood pressure test  This test is performed at every routine medical visit. The goal is less than 140/90 mmHg for most people, but 130/80 mmHg in some cases. Ask your health care provider about your goal.  Dental exam  Follow up with the dentist regularly.  Eye exam  If you are diagnosed with type 1 diabetes as a child, get an exam upon reaching the age of 37 years or older and have had diabetes for 3 to 5 years. Yearly eye exams are recommended after that initial eye exam.  If you are diagnosed with type 1 diabetes as an adult, get an exam within 5 years of diagnosis and then yearly.  If you are diagnosed with type 2 diabetes, get an exam as soon as possible after the diagnosis and then yearly.  Foot care exam  Visual foot exams are performed at every routine medical visit. The exams check for cuts, injuries, or other  problems with the feet.  A comprehensive foot exam should be done yearly. This includes visual inspection as well as assessing foot pulses and testing for loss of sensation.  Check your feet nightly for cuts, injuries, or other problems with your feet. Tell your health care provider if anything is not healing.  Kidney function test (urine microalbumin)  This test is performed once a year.  Type 1 diabetes: The first test is performed 5 years after diagnosis.  Type 2 diabetes: The first test is performed at the time of diagnosis.  A serum creatinine and estimated glomerular filtration rate (eGFR) test is done once a year to assess the level of chronic kidney disease (CKD), if present.  Lipid profile (cholesterol, HDL, LDL, triglycerides)  Performed every 5 years for most people.  The goal for LDL is less than 100 mg/dL. If you are at high risk, the goal is less than 70 mg/dL.  The goal for HDL is 40 mg/dL to 50 mg/dL for men and 50 mg/dL to 60 mg/dL for women. An HDL cholesterol of 60 mg/dL or higher gives some protection against heart disease.  The goal for triglycerides is less than 150 mg/dL.  Influenza vaccine, pneumococcal vaccine, and hepatitis B vaccine  The influenza vaccine is recommended yearly.  The pneumococcal vaccine  is generally given once in a lifetime. However, there are some instances when another vaccination is recommended. Check with your health care provider.  The hepatitis B vaccine is also recommended for adults with diabetes.  Diabetes self-management education  Education is recommended at diagnosis and ongoing as needed.  Treatment plan  Your treatment plan is reviewed at every medical visit.  Document Released: 04/05/2009 Document Revised: 02/08/2013 Document Reviewed: 11/08/2012 [facility] Patient Information 2014 [place].

## 2014-05-29 ENCOUNTER — Telehealth: Payer: Self-pay | Admitting: Pharmacist Clinician (PhC)/ Clinical Pharmacy Specialist

## 2014-05-29 LAB — LIPID PANEL
CHOLESTEROL TOTAL: 141 mg/dL (ref 100–199)
Chol/HDL Ratio: 2.6 ratio units (ref 0.0–4.4)
HDL: 54 mg/dL (ref >39)
LDL Calculated: 71 mg/dL (ref 0–99)
Triglycerides: 82 mg/dL (ref 0–149)
VLDL Cholesterol Cal: 16 mg/dL (ref 5–40)

## 2014-05-29 LAB — CMP14+EGFR
A/G RATIO: 2 (ref 1.1–2.5)
ALBUMIN: 4.4 g/dL (ref 3.5–5.5)
ALK PHOS: 74 IU/L (ref 39–117)
ALT: 55 IU/L — ABNORMAL HIGH (ref 0–32)
AST: 29 IU/L (ref 0–40)
BILIRUBIN TOTAL: 0.3 mg/dL (ref 0.0–1.2)
BUN / CREAT RATIO: 27 — AB (ref 9–23)
BUN: 22 mg/dL (ref 6–24)
CHLORIDE: 97 mmol/L (ref 97–108)
CO2: 28 mmol/L (ref 18–29)
Calcium: 9.8 mg/dL (ref 8.7–10.2)
Creatinine, Ser: 0.82 mg/dL (ref 0.57–1.00)
GFR, EST AFRICAN AMERICAN: 93 mL/min/{1.73_m2} (ref >59)
GFR, EST NON AFRICAN AMERICAN: 80 mL/min/{1.73_m2} (ref >59)
Globulin, Total: 2.2 g/dL (ref 1.5–4.5)
Glucose: 180 mg/dL — ABNORMAL HIGH (ref 65–99)
POTASSIUM: 4.3 mmol/L (ref 3.5–5.2)
Sodium: 141 mmol/L (ref 134–144)
Total Protein: 6.6 g/dL (ref 6.0–8.5)

## 2014-05-29 LAB — LDL CHOLESTEROL, DIRECT: LDL DIRECT: 83 mg/dL (ref 0–99)

## 2014-05-29 NOTE — Telephone Encounter (Signed)
Called patient back 30 minute phone call.  Her blood sugar was over 500 last night.  She took 30 units of Novolog and her blood sugar came down to 124mg /dL at bedtime and was 194mg /dL  In the morning.  She is taking Toujeo still.  She did eat potatoes and corn last night.  We discussed what to eat for diabetics.  [facility]- patient said that was fine and she would call [employer].

## 2014-05-30 ENCOUNTER — Telehealth: Payer: Self-pay | Admitting: Pharmacist

## 2014-05-30 MED ORDER — UMECLIDINIUM BROMIDE 62.5 MCG/INH IN AEPB: 1.0000 | INHALATION_SPRAY | Freq: Every day | RESPIRATORY_TRACT | Status: DC

## 2014-05-30 NOTE — Telephone Encounter (Signed)
Patient was called regarding labs from 05/28/2014. Lipids improved - continue current meds for hyperlipidemia BG and LFT's improved.  Patient called with results - mentioned history of IV drug use and known sharing of needles with Hep C positive individuals.  Hepatitis panel ordered since not done in past.  ALso asked patient about recent BG since she called yesterday when I was out of office with BG of 500.  She states that BG has been between 125 and 215 since then.  She is taking Toujeo and expecting to received order [facility] next week.  She does not think that she has enough to last until then.  [facility]  I did not have any long acting insulin samples.  She is to call back Friday 06/01/14 to see if samples have arrived - if not samples then at that time we will make a plan to cover with novolog until she received shipment of Toujeo.

## 2014-06-01 ENCOUNTER — Telehealth: Payer: Self-pay | Admitting: Pharmacist

## 2014-06-01 DIAGNOSIS — R768 Other specified abnormal immunological findings in serum: Secondary | ICD-10-CM | POA: Insufficient documentation

## 2014-06-01 LAB — HEPATITIS PANEL, ACUTE
Hep A IgM: NEGATIVE
Hep B C IgM: NEGATIVE
Hep C Virus Ab: >11 s/co ratio — ABNORMAL HIGH (ref 0.0–0.9)
Hepatitis B Surface Ag: NEGATIVE

## 2014-06-01 LAB — SPECIMEN STATUS REPORT

## 2014-06-01 MED ORDER — INSULIN ASPART PROT & ASPART (70-30 MIX) 100 UNIT/ML ~~LOC~~ SUSP: 30.0000 [IU] | Freq: Two times a day (BID) | SUBCUTANEOUS | Status: DC

## 2014-06-01 NOTE — Telephone Encounter (Signed)
Patient's recent BG have been 295, 202, 153, 135, 194 and 109. She took last dose of Toujeo this am.  I am out of samples of any long acting insulin and patient will not receive Toujeo from mail order until next week.  She cannot afford to purchase at local pharmacy.   I did have Novolog Mix 70/30 insulin samples.  Gave #2 with instruction to injection 30 units prior to breakfast and supper.  Also had #14 pen needles for patient.   Hepatitis panel returns and shows positive Hep C AB.  Patient referred to infectious disease and discussed proper use and disposal of sharps.

## 2014-06-08 ENCOUNTER — Other Ambulatory Visit: Payer: Self-pay | Admitting: Family Medicine

## 2014-06-08 ENCOUNTER — Telehealth: Payer: Self-pay | Admitting: Family Medicine

## 2014-06-08 MED ORDER — GABAPENTIN 600 MG PO TABS: 600.0000 mg | ORAL_TABLET | Freq: Three times a day (TID) | ORAL | Status: DC

## 2014-06-08 NOTE — Telephone Encounter (Signed)
Pt aware prescription sent to pharmacy °

## 2014-06-08 NOTE — Telephone Encounter (Signed)
Gabapentin rx doubled and sent to pharm

## 2014-06-12 ENCOUNTER — Other Ambulatory Visit: Payer: Medicare HMO

## 2014-06-20 ENCOUNTER — Other Ambulatory Visit: Payer: Medicare HMO

## 2014-06-25 ENCOUNTER — Encounter: Payer: Commercial Managed Care - HMO | Admitting: Physical Medicine & Rehabilitation

## 2014-07-03 ENCOUNTER — Telehealth: Payer: Self-pay | Admitting: Family

## 2014-07-03 DIAGNOSIS — F329 Major depressive disorder, single episode, unspecified: Secondary | ICD-10-CM

## 2014-07-03 DIAGNOSIS — F32A Depression, unspecified: Secondary | ICD-10-CM

## 2014-07-03 DIAGNOSIS — F419 Anxiety disorder, unspecified: Secondary | ICD-10-CM

## 2014-07-03 NOTE — Telephone Encounter (Signed)
Last office visit 03-23-14, last refill 06-01-14.

## 2014-07-04 ENCOUNTER — Ambulatory Visit: Payer: Commercial Managed Care - HMO | Admitting: Family

## 2014-07-04 MED ORDER — CLONAZEPAM 2 MG PO TABS
2.0000 mg | ORAL_TABLET | Freq: Three times a day (TID) | ORAL | Status: DC | PRN
Start: 2014-07-04 — End: 2014-08-10

## 2014-07-04 NOTE — Telephone Encounter (Signed)
[facility] [place] and left on voicemail.

## 2014-07-10 ENCOUNTER — Other Ambulatory Visit: Payer: Self-pay | Admitting: Pharmacist Clinician (PhC)/ Clinical Pharmacy Specialist

## 2014-07-10 ENCOUNTER — Telehealth: Payer: Self-pay | Admitting: Pharmacist Clinician (PhC)/ Clinical Pharmacy Specialist

## 2014-07-10 DIAGNOSIS — E118 Type 2 diabetes mellitus with unspecified complications: Secondary | ICD-10-CM

## 2014-07-10 MED ORDER — METFORMIN HCL ER 500 MG PO TB24: 1000.0000 mg | ORAL_TABLET | Freq: Two times a day (BID) | ORAL | Status: DC

## 2014-07-10 NOTE — Telephone Encounter (Signed)
Changed metformin to 2 tabs bid for a total of 2gm a day.  Sent to pharmacy.

## 2014-07-17 ENCOUNTER — Other Ambulatory Visit: Payer: Self-pay | Admitting: Family

## 2014-07-27 ENCOUNTER — Encounter: Payer: Medicare HMO | Attending: Physical Medicine & Rehabilitation | Admitting: Physical Medicine & Rehabilitation

## 2014-08-10 ENCOUNTER — Ambulatory Visit (INDEPENDENT_AMBULATORY_CARE_PROVIDER_SITE_OTHER): Payer: Commercial Managed Care - HMO | Admitting: Pharmacist

## 2014-08-10 ENCOUNTER — Encounter: Payer: Self-pay | Admitting: Pharmacist

## 2014-08-10 VITALS — BP 122/78 | HR 72 | Ht 66.0 in | Wt 270.0 lb

## 2014-08-10 DIAGNOSIS — E119 Type 2 diabetes mellitus without complications: Secondary | ICD-10-CM

## 2014-08-10 DIAGNOSIS — F32A Depression, unspecified: Secondary | ICD-10-CM

## 2014-08-10 DIAGNOSIS — F419 Anxiety disorder, unspecified: Secondary | ICD-10-CM

## 2014-08-10 DIAGNOSIS — Z0000 Encounter for general adult medical examination without abnormal findings: Secondary | ICD-10-CM

## 2014-08-10 DIAGNOSIS — F329 Major depressive disorder, single episode, unspecified: Secondary | ICD-10-CM

## 2014-08-10 MED ORDER — METFORMIN HCL 1000 MG PO TABS: 1000.0000 mg | ORAL_TABLET | Freq: Two times a day (BID) | ORAL | Status: DC

## 2014-08-10 MED ORDER — UMECLIDINIUM BROMIDE 62.5 MCG/INH IN AEPB: 1.0000 | INHALATION_SPRAY | Freq: Every day | RESPIRATORY_TRACT | Status: AC

## 2014-08-10 MED ORDER — NICOTINE 21 MG/24HR TD PT24: 21.0000 mg | MEDICATED_PATCH | Freq: Every day | TRANSDERMAL | Status: DC

## 2014-08-10 MED ORDER — CLONAZEPAM 2 MG PO TABS
2.0000 mg | ORAL_TABLET | Freq: Three times a day (TID) | ORAL | Status: DC | PRN
Start: 2014-08-10 — End: 2014-09-06

## 2014-08-10 MED ORDER — ASPIRIN EC 81 MG PO TBEC: 81.0000 mg | DELAYED_RELEASE_TABLET | Freq: Every day | ORAL | Status: AC

## 2014-08-10 NOTE — Patient Instructions (Signed)
Recommend: Colonoscopy (gave hemocult card to check stool) Pap Smear Eye Exam Mammogram  Preventive Care for Adults A healthy lifestyle and preventive care can promote health and wellness. Preventive health guidelines for women include the following key practices.  A routine yearly physical is a good way to check with your health care provider about your health and preventive screening. It is a chance to share any concerns and updates on your health and to receive a thorough exam.  Visit your dentist for a routine exam and preventive care every 6 months. Brush your teeth twice a day and floss once a day. Good oral hygiene prevents tooth decay and gum disease.  The frequency of eye exams is based on your age, health, family medical history, use of contact lenses, and other factors. Follow your health care provider's recommendations for frequency of eye exams.  Eat a healthy diet. Foods like vegetables, fruits, whole grains, low-fat dairy products, and lean protein foods contain the nutrients you need without too many calories. Decrease your intake of foods high in solid fats, added sugars, and salt. Eat the right amount of calories for you.Get information about a proper diet from your health care provider, if necessary.  Regular physical exercise is one of the most important things you can do for your health. Most adults should get at least 150 minutes of moderate-intensity exercise (any activity that increases your heart rate and causes you to sweat) each week. In addition, most adults need muscle-strengthening exercises on 2 or more days a week.  Maintain a healthy weight. The body mass index (BMI) is a screening tool to identify possible weight problems. It provides an estimate of body fat based on height and weight. Your health care provider can find your BMI and can help you achieve or maintain a healthy weight.For adults 20 years and older:  A BMI below 18.5 is considered  underweight.  A BMI of 18.5 to 24.9 is normal.  A BMI of 25 to 29.9 is considered overweight.  A BMI of 30 and above is considered obese.  Maintain normal blood lipids and cholesterol levels by exercising and minimizing your intake of saturated fat. Eat a balanced diet with plenty of fruit and vegetables. Blood tests for lipids and cholesterol should begin at age 67 and be repeated every 5 years. If your lipid or cholesterol levels are high, you are over 50, or you are at high risk for heart disease, you may need your cholesterol levels checked more frequently.Ongoing high lipid and cholesterol levels should be treated with medicines if diet and exercise are not working.  If you smoke, find out from your health care provider how to quit. If you do not use tobacco, do not start.  Lung cancer screening is recommended for adults aged 69-80 years who are at high risk for developing lung cancer because of a history of smoking. A yearly low-dose CT scan of the lungs is recommended for people who have at least a 30-pack-year history of smoking and are a current smoker or have quit within the past 15 years. A pack year of smoking is smoking an average of 1 pack of cigarettes a day for 1 year (for example: 1 pack a day for 30 years or 2 packs a day for 15 years). Yearly screening should continue until the smoker has stopped smoking for at least 15 years. Yearly screening should be stopped for people who develop a health problem that would prevent them from having lung cancer  treatment.  If you are pregnant, do not drink alcohol. If you are breastfeeding, be very cautious about drinking alcohol. If you are not pregnant and choose to drink alcohol, do not have more than 1 drink per day. One drink is considered to be 12 ounces (355 mL) of beer, 5 ounces (148 mL) of wine, or 1.5 ounces (44 mL) of liquor.  Avoid use of street drugs. Do not share needles with anyone. Ask for help if you need support or  instructions about stopping the use of drugs.  High blood pressure causes heart disease and increases the risk of stroke. Your blood pressure should be checked at least every 1 to 2 years. Ongoing high blood pressure should be treated with medicines if weight loss and exercise do not work.  If you are 29-3 years old, ask your health care provider if you should take aspirin to prevent strokes.  Diabetes screening involves taking a blood sample to check your fasting blood sugar level. This should be done once every 3 years, after age 74, if you are within normal weight and without risk factors for diabetes. Testing should be considered at a younger age or be carried out more frequently if you are overweight and have at least 1 risk factor for diabetes.  Breast cancer screening is essential preventive care for women. You should practice "breast self-awareness." This means understanding the normal appearance and feel of your breasts and may include breast self-examination. Any changes detected, no matter how small, should be reported to a health care provider. Women in their 12s and 30s should have a clinical breast exam (CBE) by a health care provider as part of a regular health exam every 1 to 3 years. After age 51, women should have a CBE every year. Starting at age 52, women should consider having a mammogram (breast X-ray test) every year. Women who have a family history of breast cancer should talk to their health care provider about genetic screening. Women at a high risk of breast cancer should talk to their health care providers about having an MRI and a mammogram every year.  Breast cancer gene (BRCA)-related cancer risk assessment is recommended for women who have family members with BRCA-related cancers. BRCA-related cancers include breast, ovarian, tubal, and peritoneal cancers. Having family members with these cancers may be associated with an increased risk for harmful changes (mutations) in  the breast cancer genes BRCA1 and BRCA2. Results of the assessment will determine the need for genetic counseling and BRCA1 and BRCA2 testing.  Routine pelvic exams to screen for cancer are no longer recommended for nonpregnant women who are considered low risk for cancer of the pelvic organs (ovaries, uterus, and vagina) and who do not have symptoms. Ask your health care provider if a screening pelvic exam is right for you.  If you have had past treatment for cervical cancer or a condition that could lead to cancer, you need Pap tests and screening for cancer for at least 20 years after your treatment. If Pap tests have been discontinued, your risk factors (such as having a new sexual partner) need to be reassessed to determine if screening should be resumed. Some women have medical problems that increase the chance of getting cervical cancer. In these cases, your health care provider may recommend more frequent screening and Pap tests.  The HPV test is an additional test that may be used for cervical cancer screening. The HPV test looks for the virus that can cause the  cell changes on the cervix. The cells collected during the Pap test can be tested for HPV. The HPV test could be used to screen women aged 38 years and older, and should be used in women of any age who have unclear Pap test results. After the age of 28, women should have HPV testing at the same frequency as a Pap test.  Colorectal cancer can be detected and often prevented. Most routine colorectal cancer screening begins at the age of 33 years and continues through age 52 years. However, your health care provider may recommend screening at an earlier age if you have risk factors for colon cancer. On a yearly basis, your health care provider may provide home test kits to check for hidden blood in the stool. Use of a small camera at the end of a tube, to directly examine the colon (sigmoidoscopy or colonoscopy), can detect the earliest forms  of colorectal cancer. Talk to your health care provider about this at age 49, when routine screening begins. Direct exam of the colon should be repeated every 5-10 years through age 2 years, unless early forms of pre-cancerous polyps or small growths are found.  People who are at an increased risk for hepatitis B should be screened for this virus. You are considered at high risk for hepatitis B if:  You were born in a country where hepatitis B occurs often. Talk with your health care provider about which countries are considered high risk.  Your parents were born in a high-risk country and you have not received a shot to protect against hepatitis B (hepatitis B vaccine).  You have HIV or AIDS.  You use needles to inject street drugs.  You live with, or have sex with, someone who has hepatitis B.  You get hemodialysis treatment.  You take certain medicines for conditions like cancer, organ transplantation, and autoimmune conditions.  Hepatitis C blood testing is recommended for all people born from [birth year] through 1965 and any individual with known risks for hepatitis C.  Practice safe sex. Use condoms and avoid high-risk sexual practices to reduce the spread of sexually transmitted infections (STIs). STIs include gonorrhea, chlamydia, syphilis, trichomonas, herpes, HPV, and human immunodeficiency virus (HIV). Herpes, HIV, and HPV are viral illnesses that have no cure. They can result in disability, cancer, and death.  You should be screened for sexually transmitted illnesses (STIs) including gonorrhea and chlamydia if:  You are sexually active and are younger than 24 years.  You are older than 24 years and your health care provider tells you that you are at risk for this type of infection.  Your sexual activity has changed since you were last screened and you are at an increased risk for chlamydia or gonorrhea. Ask your health care provider if you are at risk.  If you are at risk of  being infected with HIV, it is recommended that you take a prescription medicine daily to prevent HIV infection. This is called preexposure prophylaxis (PrEP). You are considered at risk if:  You are a heterosexual woman, are sexually active, and are at increased risk for HIV infection.  You take drugs by injection.  You are sexually active with a partner who has HIV.  Talk with your health care provider about whether you are at high risk of being infected with HIV. If you choose to begin PrEP, you should first be tested for HIV. You should then be tested every 3 months for as long as you are taking  PrEP.  Osteoporosis is a disease in which the bones lose minerals and strength with aging. This can result in serious bone fractures or breaks. The risk of osteoporosis can be identified using a bone density scan. Women ages 14 years and over and women at risk for fractures or osteoporosis should discuss screening with their health care providers. Ask your health care provider whether you should take a calcium supplement or vitamin D to reduce the rate of osteoporosis.  Menopause can be associated with physical symptoms and risks. Hormone replacement therapy is available to decrease symptoms and risks. You should talk to your health care provider about whether hormone replacement therapy is right for you.  Use sunscreen. Apply sunscreen liberally and repeatedly throughout the day. You should seek shade when your shadow is shorter than you. Protect yourself by wearing long sleeves, pants, a wide-brimmed hat, and sunglasses year round, whenever you are outdoors.  Once a month, do a whole body skin exam, using a mirror to look at the skin on your back. Tell your health care provider of new moles, moles that have irregular borders, moles that are larger than a pencil eraser, or moles that have changed in shape or color.  Stay current with required vaccines (immunizations).  Influenza vaccine. All adults  should be immunized every year.  Tetanus, diphtheria, and acellular pertussis (Td, Tdap) vaccine. Pregnant women should receive 1 dose of Tdap vaccine during each pregnancy. The dose should be obtained regardless of the length of time since the last dose. Immunization is preferred during the 27th-36th week of gestation. An adult who has not previously received Tdap or who does not know her vaccine status should receive 1 dose of Tdap. This initial dose should be followed by tetanus and diphtheria toxoids (Td) booster doses every 10 years. Adults with an unknown or incomplete history of completing a 3-dose immunization series with Td-containing vaccines should begin or complete a primary immunization series including a Tdap dose. Adults should receive a Td booster every 10 years.  Varicella vaccine. An adult without evidence of immunity to varicella should receive 2 doses or a second dose if she has previously received 1 dose. Pregnant females who do not have evidence of immunity should receive the first dose after pregnancy. This first dose should be obtained before leaving the health care facility. The second dose should be obtained 4-8 weeks after the first dose.  Human papillomavirus (HPV) vaccine. Females aged 13-26 years who have not received the vaccine previously should obtain the 3-dose series. The vaccine is not recommended for use in pregnant females. However, pregnancy testing is not needed before receiving a dose. If a female is found to be pregnant after receiving a dose, no treatment is needed. In that case, the remaining doses should be delayed until after the pregnancy. Immunization is recommended for any person with an immunocompromised condition through the age of 44 years if she did not get any or all doses earlier. During the 3-dose series, the second dose should be obtained 4-8 weeks after the first dose. The third dose should be obtained 24 weeks after the first dose and 16 weeks after  the second dose.  Zoster vaccine. One dose is recommended for adults aged 69 years or older unless certain conditions are present.  Measles, mumps, and rubella (MMR) vaccine. Adults born before [birth year] generally are considered immune to measles and mumps. Adults born in [birth year] or later should have 1 or more doses of MMR vaccine unless there  is a contraindication to the vaccine or there is laboratory evidence of immunity to each of the three diseases. A routine second dose of MMR vaccine should be obtained at least 28 days after the first dose for students attending postsecondary schools, health care workers, or international travelers. People who received inactivated measles vaccine or an unknown type of measles vaccine during 1963-1967 should receive 2 doses of MMR vaccine. People who received inactivated mumps vaccine or an unknown type of mumps vaccine before 1979 and are at high risk for mumps infection should consider immunization with 2 doses of MMR vaccine. For females of childbearing age, rubella immunity should be determined. If there is no evidence of immunity, females who are not pregnant should be vaccinated. If there is no evidence of immunity, females who are pregnant should delay immunization until after pregnancy. Unvaccinated health care workers born before [birth year] who lack laboratory evidence of measles, mumps, or rubella immunity or laboratory confirmation of disease should consider measles and mumps immunization with 2 doses of MMR vaccine or rubella immunization with 1 dose of MMR vaccine.  Pneumococcal 13-valent conjugate (PCV13) vaccine. When indicated, a person who is uncertain of her immunization history and has no record of immunization should receive the PCV13 vaccine. An adult aged 50 years or older who has certain medical conditions and has not been previously immunized should receive 1 dose of PCV13 vaccine. This PCV13 should be followed with a dose of pneumococcal polysaccharide (PPSV23)  vaccine. The PPSV23 vaccine dose should be obtained at least 8 weeks after the dose of PCV13 vaccine. An adult aged 33 years or older who has certain medical conditions and previously received 1 or more doses of PPSV23 vaccine should receive 1 dose of PCV13. The PCV13 vaccine dose should be obtained 1 or more years after the last PPSV23 vaccine dose.  Pneumococcal polysaccharide (PPSV23) vaccine. When PCV13 is also indicated, PCV13 should be obtained first. All adults aged 53 years and older should be immunized. An adult younger than age 43 years who has certain medical conditions should be immunized. Any person who resides in a nursing home or long-term care facility should be immunized. An adult smoker should be immunized. People with an immunocompromised condition and certain other conditions should receive both PCV13 and PPSV23 vaccines. People with human immunodeficiency virus (HIV) infection should be immunized as soon as possible after diagnosis. Immunization during chemotherapy or radiation therapy should be avoided. Routine use of PPSV23 vaccine is not recommended for American Indians, [place] Natives, or people younger than 65 years unless there are medical conditions that require PPSV23 vaccine. When indicated, people who have unknown immunization and have no record of immunization should receive PPSV23 vaccine. One-time revaccination 5 years after the first dose of PPSV23 is recommended for people aged 19-64 years who have chronic kidney failure, nephrotic syndrome, asplenia, or immunocompromised conditions. People who received 1-2 doses of PPSV23 before age 28 years should receive another dose of PPSV23 vaccine at age 79 years or later if at least 5 years have passed since the previous dose. Doses of PPSV23 are not needed for people immunized with PPSV23 at or after age 22 years.  Meningococcal vaccine. Adults with asplenia or persistent complement component deficiencies should receive 2 doses of  quadrivalent meningococcal conjugate (MenACWY-D) vaccine. The doses should be obtained at least 2 months apart. Microbiologists working with certain meningococcal bacteria, [place] recruits, people at risk during an outbreak, and people who travel to or live in countries with a  high rate of meningitis should be immunized. A first-year college student up through age 28 years who is living in a residence hall should receive a dose if she did not receive a dose on or after her 16th birthday. Adults who have certain high-risk conditions should receive one or more doses of vaccine.  Hepatitis A vaccine. Adults who wish to be protected from this disease, have certain high-risk conditions, work with hepatitis A-infected animals, work in hepatitis A research labs, or travel to or work in countries with a high rate of hepatitis A should be immunized. Adults who were previously unvaccinated and who anticipate close contact with an international adoptee during the first 60 days after arrival in the [place] from a country with a high rate of hepatitis A should be immunized.  Hepatitis B vaccine. Adults who wish to be protected from this disease, have certain high-risk conditions, may be exposed to blood or other infectious body fluids, are household contacts or sex partners of hepatitis B positive people, are clients or workers in certain care facilities, or travel to or work in countries with a high rate of hepatitis B should be immunized.  Haemophilus influenzae type b (Hib) vaccine. A previously unvaccinated person with asplenia or sickle cell disease or having a scheduled splenectomy should receive 1 dose of Hib vaccine. Regardless of previous immunization, a recipient of a hematopoietic stem cell transplant should receive a 3-dose series 6-12 months after her successful transplant. Hib vaccine is not recommended for adults with HIV infection. Preventive Services / Frequency Ages 72 to 75 years  Blood  pressure check.** / Every 1 to 2 years.  Lipid and cholesterol check.** / Every 5 years beginning at age 52.  Clinical breast exam.** / Every 3 years for women in their 54s and 15s.  BRCA-related cancer risk assessment.** / For women who have family members with a BRCA-related cancer (breast, ovarian, tubal, or peritoneal cancers).  Pap test.** / Every 2 years from ages 46 through 49. Every 3 years starting at age 54 through age 65 or 20 with a history of 3 consecutive normal Pap tests.  HPV screening.** / Every 3 years from ages 10 through ages 22 to 6 with a history of 3 consecutive normal Pap tests.  Hepatitis C blood test.** / For any individual with known risks for hepatitis C.  Skin self-exam. / Monthly.  Influenza vaccine. / Every year.  Tetanus, diphtheria, and acellular pertussis (Tdap, Td) vaccine.** / Consult your health care provider. Pregnant women should receive 1 dose of Tdap vaccine during each pregnancy. 1 dose of Td every 10 years.  Varicella vaccine.** / Consult your health care provider. Pregnant females who do not have evidence of immunity should receive the first dose after pregnancy.  HPV vaccine. / 3 doses over 6 months, if 65 and younger. The vaccine is not recommended for use in pregnant females. However, pregnancy testing is not needed before receiving a dose.  Measles, mumps, rubella (MMR) vaccine.** / You need at least 1 dose of MMR if you were born in 1957 or later. You may also need a 2nd dose. For females of childbearing age, rubella immunity should be determined. If there is no evidence of immunity, females who are not pregnant should be vaccinated. If there is no evidence of immunity, females who are pregnant should delay immunization until after pregnancy.  Pneumococcal 13-valent conjugate (PCV13) vaccine.** / Consult your health care provider.  Pneumococcal polysaccharide (PPSV23) vaccine.** / 1 to 2  doses if you smoke cigarettes or if you have certain  conditions.  Meningococcal vaccine.** / 1 dose if you are age 88 to 25 years and a Market researcher living in a residence hall, or have one of several medical conditions, you need to get vaccinated against meningococcal disease. You may also need additional booster doses.  Hepatitis A vaccine.** / Consult your health care provider.  Hepatitis B vaccine.** / Consult your health care provider.  Haemophilus influenzae type b (Hib) vaccine.** / Consult your health care provider. Ages 55 to 108 years  Blood pressure check.** / Every 1 to 2 years.  Lipid and cholesterol check.** / Every 5 years beginning at age 62 years.  Lung cancer screening. / Every year if you are aged 63-80 years and have a 30-pack-year history of smoking and currently smoke or have quit within the past 15 years. Yearly screening is stopped once you have quit smoking for at least 15 years or develop a health problem that would prevent you from having lung cancer treatment.  Clinical breast exam.** / Every year after age 52 years.  BRCA-related cancer risk assessment.** / For women who have family members with a BRCA-related cancer (breast, ovarian, tubal, or peritoneal cancers).  Mammogram.** / Every year beginning at age 62 years and continuing for as long as you are in good health. Consult with your health care provider.  Pap test.** / Every 3 years starting at age 47 years through age 61 or 65 years with a history of 3 consecutive normal Pap tests.  HPV screening.** / Every 3 years from ages 96 years through ages 9 to 43 years with a history of 3 consecutive normal Pap tests.  Fecal occult blood test (FOBT) of stool. / Every year beginning at age 41 years and continuing until age 59 years. You may not need to do this test if you get a colonoscopy every 10 years.  Flexible sigmoidoscopy or colonoscopy.** / Every 5 years for a flexible sigmoidoscopy or every 10 years for a colonoscopy beginning at age 74 years  and continuing until age 55 years.  Hepatitis C blood test.** / For all people born from [birth year] through 1963 and any individual with known risks for hepatitis C.  Skin self-exam. / Monthly.  Influenza vaccine. / Every year.  Tetanus, diphtheria, and acellular pertussis (Tdap/Td) vaccine.** / Consult your health care provider. Pregnant women should receive 1 dose of Tdap vaccine during each pregnancy. 1 dose of Td every 10 years.  Varicella vaccine.** / Consult your health care provider. Pregnant females who do not have evidence of immunity should receive the first dose after pregnancy.  Zoster vaccine.** / 1 dose for adults aged 21 years or older.  Measles, mumps, rubella (MMR) vaccine.** / You need at least 1 dose of MMR if you were born in 1957 or later. You may also need a 2nd dose. For females of childbearing age, rubella immunity should be determined. If there is no evidence of immunity, females who are not pregnant should be vaccinated. If there is no evidence of immunity, females who are pregnant should delay immunization until after pregnancy.  Pneumococcal 13-valent conjugate (PCV13) vaccine.** / Consult your health care provider.  Pneumococcal polysaccharide (PPSV23) vaccine.** / 1 to 2 doses if you smoke cigarettes or if you have certain conditions.  Meningococcal vaccine.** / Consult your health care provider.  Hepatitis A vaccine.** / Consult your health care provider.  Hepatitis B vaccine.** / Consult your health care provider.  Haemophilus influenzae type b (Hib) vaccine.** / Consult your health care provider. Ages 13 years and over  Blood pressure check.** / Every 1 to 2 years.  Lipid and cholesterol check.** / Every 5 years beginning at age 27 years.  Lung cancer screening. / Every year if you are aged 23-80 years and have a 30-pack-year history of smoking and currently smoke or have quit within the past 15 years. Yearly screening is stopped once you have quit smoking  for at least 15 years or develop a health problem that would prevent you from having lung cancer treatment.  Clinical breast exam.** / Every year after age 82 years.  BRCA-related cancer risk assessment.** / For women who have family members with a BRCA-related cancer (breast, ovarian, tubal, or peritoneal cancers).  Mammogram.** / Every year beginning at age 35 years and continuing for as long as you are in good health. Consult with your health care provider.  Pap test.** / Every 3 years starting at age 73 years through age 52 or 56 years with 3 consecutive normal Pap tests. Testing can be stopped between 65 and 70 years with 3 consecutive normal Pap tests and no abnormal Pap or HPV tests in the past 10 years.  HPV screening.** / Every 3 years from ages 16 years through ages 1 or 41 years with a history of 3 consecutive normal Pap tests. Testing can be stopped between 65 and 70 years with 3 consecutive normal Pap tests and no abnormal Pap or HPV tests in the past 10 years.  Fecal occult blood test (FOBT) of stool. / Every year beginning at age 62 years and continuing until age 75 years. You may not need to do this test if you get a colonoscopy every 10 years.  Flexible sigmoidoscopy or colonoscopy.** / Every 5 years for a flexible sigmoidoscopy or every 10 years for a colonoscopy beginning at age 56 years and continuing until age 45 years.  Hepatitis C blood test.** / For all people born from [birth year] through 1963 and any individual with known risks for hepatitis C.  Osteoporosis screening.** / A one-time screening for women ages 56 years and over and women at risk for fractures or osteoporosis.  Skin self-exam. / Monthly.  Influenza vaccine. / Every year.  Tetanus, diphtheria, and acellular pertussis (Tdap/Td) vaccine.** / 1 dose of Td every 10 years.  Varicella vaccine.** / Consult your health care provider.  Zoster vaccine.** / 1 dose for adults aged 44 years or older.  Pneumococcal  13-valent conjugate (PCV13) vaccine.** / Consult your health care provider.  Pneumococcal polysaccharide (PPSV23) vaccine.** / 1 dose for all adults aged 70 years and older.  Meningococcal vaccine.** / Consult your health care provider.  Hepatitis A vaccine.** / Consult your health care provider.  Hepatitis B vaccine.** / Consult your health care provider.  Haemophilus influenzae type b (Hib) vaccine.** / Consult your health care provider. ** Family history and personal history of risk and conditions may change your health care provider's recommendations. Document Released: 08/04/2001 Document Revised: 10/23/2013 Document Reviewed: 11/03/2010 [facility] Patient Information 2015 [place], [place]. This information is not intended to replace advice given to you by your health care provider. Make sure you discuss any questions you have with your health care provider.  Fall Prevention and Home Safety Falls cause injuries and can affect all age groups. It is possible to use preventive measures to significantly decrease the likelihood of falls. There are many simple measures which can make your home safer  and prevent falls. OUTDOORS  Repair cracks and edges of walkways and driveways.  Remove high doorway thresholds.  Trim shrubbery on the main path into your home.  Have good outside lighting.  Clear walkways of tools, rocks, debris, and clutter.  Check that handrails are not broken and are securely fastened. Both sides of steps should have handrails.  Have leaves, snow, and ice cleared regularly.  Use sand or salt on walkways during winter months.  In the garage, clean up grease or oil spills. BATHROOM  Install night lights.  Install grab bars by the toilet and in the tub and shower.  Use non-skid mats or decals in the tub or shower.  Place a plastic non-slip stool in the shower to sit on, if needed.  Keep floors dry and clean up all water on the floor immediately.  Remove soap  buildup in the tub or shower on a regular basis.  Secure bath mats with non-slip, double-sided rug tape.  Remove throw rugs and tripping hazards from the floors. BEDROOMS  Install night lights.  Make sure a bedside light is easy to reach.  Do not use oversized bedding.  Keep a telephone by your bedside.  Have a firm chair with side arms to use for getting dressed.  Remove throw rugs and tripping hazards from the floor. KITCHEN  Keep handles on pots and pans turned toward the center of the stove. Use back burners when possible.  Clean up spills quickly and allow time for drying.  Avoid walking on wet floors.  Avoid hot utensils and knives.  Position shelves so they are not too high or low.  Place commonly used objects within easy reach.  If necessary, use a sturdy step stool with a grab bar when reaching.  Keep electrical cables out of the way.  Do not use floor polish or wax that makes floors slippery. If you must use wax, use non-skid floor wax.  Remove throw rugs and tripping hazards from the floor. STAIRWAYS  Never leave objects on stairs.  Place handrails on both sides of stairways and use them. Fix any loose handrails. Make sure handrails on both sides of the stairways are as long as the stairs.  Check carpeting to make sure it is firmly attached along stairs. Make repairs to worn or loose carpet promptly.  Avoid placing throw rugs at the top or bottom of stairways, or properly secure the rug with carpet tape to prevent slippage. Get rid of throw rugs, if possible.  Have an electrician put in a light switch at the top and bottom of the stairs. OTHER FALL PREVENTION TIPS  Wear low-heel or rubber-soled shoes that are supportive and fit well. Wear closed toe shoes.  When using a stepladder, make sure it is fully opened and both spreaders are firmly locked. Do not climb a closed stepladder.  Add color or contrast paint or tape to grab bars and handrails in  your home. Place contrasting color strips on first and last steps.  Learn and use mobility aids as needed. Install an electrical emergency response system.  Turn on lights to avoid dark areas. Replace light bulbs that burn out immediately. Get light switches that glow.  Arrange furniture to create clear pathways. Keep furniture in the same place.  Firmly attach carpet with non-skid or double-sided tape.  Eliminate uneven floor surfaces.  Select a carpet pattern that does not visually hide the edge of steps.  Be aware of all pets. OTHER HOME SAFETY TIPS  Set the water temperature for 120 F (48.8 C).  Keep emergency numbers on or near the telephone.  Keep smoke detectors on every level of the home and near sleeping areas. Document Released: 05/29/2002 Document Revised: 12/08/2011 Document Reviewed: 08/28/2011 [facility] Patient Information 2015 [place], [place]. This information is not intended to replace advice given to you by your health care provider. Make sure you discuss any questions you have with your health care provider.

## 2014-08-10 NOTE — Progress Notes (Signed)
Patient ID: Gail LinesDonna Preston, female   DOB: 02-02-1956, 59 y.o.   MRN: [id number]    Subjective:   Gail Preston is a 59 y.o. female who presents for an Initial Medicare Annual Wellness Visit.  Patient also wants to discuss type 2 DM.   She was last seen about 2 months ago for diabetes education  Current medications for diabetes:  Toujeo 50 units qd;  Metformin 500mg  1 tablet BID and Novolog as needed - 5 untis when BG over 250. Has only had to use Novolog about 3 times in the last month.  RBG in office today is 114mg /dL Home BG Monitoring:  Checking 3-4 times a day.  Lab Results  Component Value Date   HGBA1C 12.0% 11/14/2013   HGBA1C 7.8% 02/22/2014   HBGA1C 8.6% 05/28/2014      Current Medications (verified) Outpatient Encounter Prescriptions as of 08/10/2014  Medication Sig  . ACCU-CHEK AVIVA PLUS test strip   . amLODipine (NORVASC) 10 MG tablet Take 10 mg by mouth daily.  Marland Kitchen. atorvastatin (LIPITOR) 40 MG tablet Take 40 mg by mouth daily.  . benazepril-hydrochlorthiazide (LOTENSIN HCT) 20-25 MG per tablet Take 1 tablet by mouth daily.  . carvedilol (COREG) 25 MG tablet Take 1 tablet (25 mg total) by mouth 2 (two) times daily with a meal.  . clonazePAM (KLONOPIN) 2 MG tablet Take 1 tablet (2 mg total) by mouth 3 (three) times daily as needed.  . cloNIDine (CATAPRES) 0.3 MG tablet Take 1 tablet (0.3 mg total) by mouth 2 (two) times daily.  Marland Kitchen. gabapentin (NEURONTIN) 600 MG tablet Take 1 tablet (600 mg total) by mouth 3 (three) times daily.  . Insulin Glargine (TOUJEO SOLOSTAR) 300 UNIT/ML SOPN Inject 55 Units into the skin daily.  . Insulin Pen Needle (PEN NEEDLES) 31G X 6 MM MISC Use with insulin pens to inject insulin up to qid.  Dx  E11.9  Type 2 DM treated with insulin  . Melatonin 5 MG TABS Take 5 mg by mouth daily.  Marland Kitchen. NOVOLOG 100 UNIT/ML injection INJECT 5 UNITS AS NEEDED FOR BLOOD GLUCOSE > 250. CALL OFFICE FOR INSULIN ADJUSTMENT IF NEEDED MORE THAN 2 TIMES PER WEEK  . sertraline  (ZOLOFT) 50 MG tablet Take 50 mg by mouth daily.  . [DISCONTINUED] clonazePAM (KLONOPIN) 2 MG tablet Take 1 tablet (2 mg total) by mouth 3 (three) times daily as needed.  . [DISCONTINUED] metFORMIN (GLUCOPHAGE-XR) 500 MG 24 hr tablet Take 2 tablets (1,000 mg total) by mouth 2 (two) times daily. (Patient taking differently: Take 500 mg by mouth 2 (two) times daily. )  . aspirin EC 81 MG tablet Take 1 tablet (81 mg total) by mouth daily.  . insulin aspart protamine- aspart (NOVOLOG MIX 70/30) (70-30) 100 UNIT/ML injection Inject 0.3 mLs (30 Units total) into the skin 2 (two) times daily with a meal. Samples only until patient can get Toujeo and will then restart Toujeo. (Patient not taking: Reported on 08/10/2014)  . ipratropium-albuterol (DUONEB) 0.5-2.5 (3) MG/3ML SOLN Take 3 mLs by nebulization every 2 (two) hours as needed. (Patient not taking: Reported on 05/28/2014)  . metFORMIN (GLUCOPHAGE) 1000 MG tablet Take 1 tablet (1,000 mg total) by mouth 2 (two) times daily with a meal.  . nicotine (EQ NICOTINE) 21 mg/24hr patch Place 1 patch (21 mg total) onto the skin daily.  Marland Kitchen. Umeclidinium Bromide (INCRUSE ELLIPTA) 62.5 MCG/INH AEPB Inhale 1 puff into the lungs daily.  . [DISCONTINUED] gabapentin (NEURONTIN) 300 MG capsule TAKE 1 CAPSULE  THREE TIMES DAILY (Patient not taking: Reported on 08/10/2014)  . [DISCONTINUED] metFORMIN (GLUCOPHAGE) 1000 MG tablet Take 1 tablet (1,000 mg total) by mouth 2 (two) times daily with a meal.  . [DISCONTINUED] Umeclidinium Bromide (INCRUSE ELLIPTA) 62.5 MCG/INH AEPB Inhale 1 puff into the lungs daily. (Patient not taking: Reported on 08/10/2014)    Allergies (verified) Nsaids and Penicillins   History: Past Medical History  Diagnosis Date  . Fibromyalgia   . Hypertension   . Hyperlipidemia   . Pneumonia   . Diabetes mellitus without complication   . Depression    Past Surgical History  Procedure Laterality Date  . Tubal ligation    . Fracture surgery    .  Tonsillectomy Bilateral   . Carpal tunnel release Right   . Cardiac catheterization     Family History  Problem Relation Age of Onset  . Cancer Mother     melanoma  . Heart disease Father   . Heart attack Father 100  . Depression Sister   . Schizophrenia Sister   . Bipolar disorder Sister   . Drug abuse Son     recovered  . Drug abuse Son    Social History   Occupational History  . Not on file.   Social History Main Topics  . Smoking status: Current Every Day Smoker -- 0.50 packs/day for 12 years  . Smokeless tobacco: Not on file  . Alcohol Use: No  . Drug Use: No     Comment: former heroin  . Sexual Activity: No    Do you feel safe at home?  Yes  Current exercise habits: The patient does not participate in regular exercise at present.  Current Dietary issues habits:  Discussed limiting CHO and sugar in diet    Objective:    Today's Vitals   08/10/14 1038  BP: 122/78  Pulse: 72  Height:  (1.676 m)  Weight: 270 lb (122.471 kg)  PainSc: 8   PainLoc: Back  Patient declined pain clinic referral because she does not want to take narcotic medication for pain.  She has experienced family members overdosing on narcotic medications.    Activities of Daily Living In your present state of health, do you have any difficulty performing the following activities: 08/10/2014 03/24/2014  Is the patient deaf or have difficulty hearing? N -  Hearing N -  Vision N -  Difficulty concentrating or making decisions Y -  Walking or climbing stairs? N -  Doing errands, shopping? N N    Are there smokers in your home (other than you)? Yes  Hearing Difficulties: No Do you often ask people to speak up or repeat themselves? No Do you experience ringing or noises in your ears? No Do you have difficulty understanding soft or whispered voices? No    Cardiac risk factors: diabetes mellitus, dyslipidemia, family history of premature cardiovascular disease, obesity (BMI >= 30  kg/m2), sedentary lifestyle and smoking/ tobacco exposure.  Depression Screen PHQ 2/9 Scores 08/10/2014 05/28/2014 11/14/2013  PHQ - 2 Score 0 0 0    Fall Risk Fall Risk  08/10/2014 05/28/2014 11/14/2013  Falls in the past year? Yes Yes Yes  Number falls in past yr: 1 2 or more 2 or more  Injury with Fall? No - -  Risk Factor Category  High Fall Risk High Fall Risk -  Risk for fall due to : History of fall(s) Impaired balance/gait;Medication side effect;History of fall(s) Impaired balance/gait    Cognitive Function: MMSE -  Mini Mental State Exam 08/10/2014  Orientation to time 5  Orientation to Place 5  Registration 3  Attention/ Calculation 5  Recall 3  Language- name 2 objects 2  Language- repeat 1  Language- follow 3 step command 3  Language- read & follow direction 1  Write a sentence 1  Copy design 1  Total score 30    Immunizations and Health Maintenance Immunization History  Administered Date(s) Administered  . Influenza,inj,Quad PF,36+ Mos 03/23/2014  . Pneumococcal Polysaccharide-23 02/22/2014  . Tdap 02/22/2014   There are no preventive care reminders to display for this patient.  Patient Care Team: Junie Spencer, FNP as PCP - General (Nurse Practitioner)  Indicate any recent Medical Services you may have received from other than [facility] providers in the past year (date may be approximate).    Assessment:    Annual Wellness Visit  Type 2 DM - improving control but not at goals yet. Tobacco Abuse - patient is ready to commit to quitting smoking  Screening Tests Health Maintenance  Topic Date Due  . PAP SMEAR  08/23/2014 (Originally 01/13/1976)  . OPHTHALMOLOGY EXAM  08/23/2014 (Originally 01/13/1968)  . COLONOSCOPY  08/23/2014 (Originally 01/13/2008)  . HEMOGLOBIN A1C  11/27/2014  . INFLUENZA VACCINE  01/21/2015  . FOOT EXAM  02/23/2015  . MAMMOGRAM  02/23/2015  . URINE MICROALBUMIN  02/23/2015  . PNEUMOCOCCAL POLYSACCHARIDE VACCINE (2) 02/23/2019  .  TETANUS/TDAP  02/23/2024      Plan:   During the course of the visit Algie was educated and counseled about the following appropriate screening and preventive services:   Vaccines to include Influenza, Hepatitis B, Td..    Colorectal cancer screening - patient declined colonoscopy but too FOBT   Mammogram - discussed; patient declined  PAP - appt for March 2016  Glaucoma screening - discussed - patient has list of optomotrists from [facility]   Nutrition counseling - reviewed CHO counting   Smoking cessation counseling - start nicotine  patches - apply 1 patch daily  Increase metformin to  bid with food  Start ASA  1 tablet daily  Gave #2 samples of Incruse   Patient Instructions (the written plan) were given to the patient.   Henrene Pastor, [facility]   08/10/2014

## 2014-08-13 ENCOUNTER — Telehealth: Payer: Self-pay | Admitting: Pharmacist

## 2014-08-13 MED ORDER — METFORMIN HCL 1000 MG PO TABS: 500.0000 mg | ORAL_TABLET | Freq: Two times a day (BID) | ORAL | Status: DC

## 2014-08-13 NOTE — Telephone Encounter (Signed)
Unable to speak to patient but I left a message to try cutting metformin 1000mg  in 1/2 and take 1/2 tablet twice a day with food.  If this does not help advised her to cal me back and I can send in Rx for 500mg .

## 2014-09-06 ENCOUNTER — Ambulatory Visit (INDEPENDENT_AMBULATORY_CARE_PROVIDER_SITE_OTHER): Payer: Commercial Managed Care - HMO | Admitting: Family

## 2014-09-06 ENCOUNTER — Encounter: Payer: Self-pay | Admitting: Family

## 2014-09-06 VITALS — BP 127/81 | HR 75 | Temp 98.0°F | Ht 66.0 in | Wt 262.2 lb

## 2014-09-06 DIAGNOSIS — E785 Hyperlipidemia, unspecified: Secondary | ICD-10-CM

## 2014-09-06 DIAGNOSIS — E1142 Type 2 diabetes mellitus with diabetic polyneuropathy: Secondary | ICD-10-CM | POA: Insufficient documentation

## 2014-09-06 DIAGNOSIS — IMO0002 Reserved for concepts with insufficient information to code with codable children: Secondary | ICD-10-CM

## 2014-09-06 DIAGNOSIS — E1149 Type 2 diabetes mellitus with other diabetic neurological complication: Secondary | ICD-10-CM | POA: Diagnosis not present

## 2014-09-06 DIAGNOSIS — I10 Essential (primary) hypertension: Secondary | ICD-10-CM

## 2014-09-06 DIAGNOSIS — F32A Depression, unspecified: Secondary | ICD-10-CM

## 2014-09-06 DIAGNOSIS — Z1321 Encounter for screening for nutritional disorder: Secondary | ICD-10-CM | POA: Diagnosis not present

## 2014-09-06 DIAGNOSIS — J441 Chronic obstructive pulmonary disease with (acute) exacerbation: Secondary | ICD-10-CM | POA: Diagnosis not present

## 2014-09-06 DIAGNOSIS — E1165 Type 2 diabetes mellitus with hyperglycemia: Secondary | ICD-10-CM | POA: Diagnosis not present

## 2014-09-06 DIAGNOSIS — F329 Major depressive disorder, single episode, unspecified: Secondary | ICD-10-CM | POA: Diagnosis not present

## 2014-09-06 DIAGNOSIS — E1129 Type 2 diabetes mellitus with other diabetic kidney complication: Secondary | ICD-10-CM

## 2014-09-06 DIAGNOSIS — J449 Chronic obstructive pulmonary disease, unspecified: Secondary | ICD-10-CM | POA: Diagnosis not present

## 2014-09-06 DIAGNOSIS — F419 Anxiety disorder, unspecified: Secondary | ICD-10-CM

## 2014-09-06 DIAGNOSIS — IMO0001 Reserved for inherently not codable concepts without codable children: Secondary | ICD-10-CM | POA: Insufficient documentation

## 2014-09-06 LAB — POCT GLYCOSYLATED HEMOGLOBIN (HGB A1C): HEMOGLOBIN A1C: 7.3

## 2014-09-06 MED ORDER — CLONAZEPAM 2 MG PO TABS: 2.0000 mg | ORAL_TABLET | Freq: Three times a day (TID) | ORAL | Status: DC | PRN

## 2014-09-06 MED ORDER — CANAGLIFLOZIN 100 MG PO TABS: 100.0000 mg | ORAL_TABLET | Freq: Every day | ORAL | Status: DC

## 2014-09-06 NOTE — Progress Notes (Signed)
Subjective:    Patient ID: Gail Preston, female    DOB: 1955-12-12, 59 y.o.   MRN: [id number]  Hypertension This is a chronic problem. The current episode started more than 1 year ago. The problem has been waxing and waning since onset. The problem is uncontrolled. Associated symptoms include anxiety. Pertinent negatives include no blurred vision, headaches, palpitations, peripheral edema or shortness of breath. Risk factors for coronary artery disease include diabetes mellitus, dyslipidemia, post-menopausal state, family history and smoking/tobacco exposure. Past treatments include calcium channel blockers, ACE inhibitors, beta blockers and diuretics. The current treatment provides mild improvement. Hypertensive end-organ damage includes kidney disease and CVA. There is no history of CAD/MI, heart failure or a thyroid problem. There is no history of sleep apnea.  Diabetes She presents for her follow-up diabetic visit. She has type 2 diabetes mellitus. Her disease course has been fluctuating. Hypoglycemia symptoms include nervousness/anxiousness. Pertinent negatives for hypoglycemia include no headaches. Associated symptoms include foot paresthesias. Pertinent negatives for diabetes include no blurred vision, no foot ulcerations and no visual change. Pertinent negatives for hypoglycemia complications include no blackouts. Symptoms are stable. Diabetic complications include a CVA, nephropathy and peripheral neuropathy. Risk factors for coronary artery disease include diabetes mellitus, dyslipidemia, family history, obesity, sedentary lifestyle, post-menopausal, stress and tobacco exposure. Current diabetic treatment includes insulin injections and oral agent (monotherapy). Her breakfast blood glucose range is generally >200 mg/dl. An ACE inhibitor/angiotensin II receptor blocker is being taken. Eye exam is not current (Pt educated on importance).  Hyperlipidemia This is a chronic problem. The current  episode started more than 1 year ago. The problem is controlled. Recent lipid tests were reviewed and are normal. Exacerbating diseases include diabetes. Factors aggravating her hyperlipidemia include smoking. Pertinent negatives include no leg pain, myalgias or shortness of breath. Current antihyperlipidemic treatment includes statins. The current treatment provides significant improvement of lipids. Risk factors for coronary artery disease include diabetes mellitus, dyslipidemia, family history, hypertension, obesity, post-menopausal and a sedentary lifestyle.  Anxiety Presents for follow-up visit. Symptoms include depressed mood, insomnia and nervous/anxious behavior. Patient reports no excessive worry, palpitations or shortness of breath. Symptoms occur occasionally.   Her past medical history is significant for anxiety/panic attacks and depression. Past treatments include benzodiazephines and SSRIs. The treatment provided moderate relief. Compliance with prior treatments has been good.  Peripheral Neuropathy  Pt currently taking abapentin 600 mg TID. Pt states this helps with the pain and burning. COPD Pt states she gets SOB at times, but very rarely. Pt currently taking Incruse Ellipta daily.    Review of Systems  Constitutional: Negative.   HENT: Negative.   Eyes: Negative.  Negative for blurred vision.  Respiratory: Negative.  Negative for shortness of breath.   Cardiovascular: Negative.  Negative for palpitations.  Gastrointestinal: Negative.   Endocrine: Negative.   Genitourinary: Negative.   Musculoskeletal: Negative.  Negative for myalgias.  Neurological: Negative.  Negative for headaches.  Hematological: Negative.   Psychiatric/Behavioral: The patient is nervous/anxious and has insomnia.   All other systems reviewed and are negative.      Objective:   Physical Exam  Constitutional: She is oriented to person, place, and time. She appears well-developed and well-nourished.  No distress.  HENT:  Head: Normocephalic and atraumatic.  Right Ear: External ear normal.  Left Ear: External ear normal.  Nose: Nose normal.  Mouth/Throat: Oropharynx is clear and moist.  Eyes: Pupils are equal, round, and reactive to light.  Neck: Normal range of motion. Neck supple. No  thyromegaly present.  Cardiovascular: Normal rate, regular rhythm, normal heart sounds and intact distal pulses.   No murmur heard. Pulmonary/Chest: Effort normal and breath sounds normal. No respiratory distress. She has no wheezes.  Abdominal: Soft. Bowel sounds are normal. She exhibits no distension. There is no tenderness.  Musculoskeletal: Normal range of motion. She exhibits no edema or tenderness.  Neurological: She is alert and oriented to person, place, and time. She has normal reflexes. No cranial nerve deficit.  Skin: Skin is warm and dry.  Psychiatric: She has a normal mood and affect. Her behavior is normal. Judgment and thought content normal.  Vitals reviewed.  BP 145/82 mmHg  Pulse 72  Temp(Src) 98 F (36.7 C) (Oral)  Ht 5' 6"  (1.676 m)  Wt 262 lb 3.2 oz (118.933 kg)  BMI 42.34 kg/m2        Assessment & Plan:  1. Essential hypertension - CMP14+EGFR  2. COPD with acute exacerbation - CMP14+EGFR  3. DM (diabetes mellitus), type 2, uncontrolled, with renal complications -Pt started on Invokana today -Discussed importance of eye exam -Discussed low carb diet - POCT glycosylated hemoglobin (Hb A1C) - CMP14+EGFR - canagliflozin (INVOKANA) 100 MG TABS tablet; Take 1 tablet (100 mg total) by mouth daily.  Dispense: 90 tablet; Refill: 1  4. Depression - CMP14+EGFR - clonazePAM (KLONOPIN) 2 MG tablet; Take 1 tablet (2 mg total) by mouth 3 (three) times daily as needed.  Dispense: 90 tablet; Refill: 0  5. Anxiety - CMP14+EGFR - clonazePAM (KLONOPIN) 2 MG tablet; Take 1 tablet (2 mg total) by mouth 3 (three) times daily as needed.  Dispense: 90 tablet; Refill: 0  6.  Hyperlipidemia LDL goal <100 - CMP14+EGFR - Lipid panel  7. Peripheral sensory neuropathy due to type 2 diabetes mellitus - CMP14+EGFR  8. COPD bronchitis - CMP14+EGFR  9. Encounter for vitamin deficiency screening - Vit D  25 hydroxy (rtn osteoporosis monitoring)   Continue all meds Labs pending Health Maintenance reviewed-hemoccult cards given to patient with directions Diet and exercise encouraged RTO 3 months  Evelina Dun, FNP

## 2014-09-06 NOTE — Addendum Note (Signed)
Addended by: Prescott GumLAND, Dagen Beevers M on: 09/06/2014 10:43 AM   Modules accepted: Kipp BroodSmartSet

## 2014-09-06 NOTE — Patient Instructions (Signed)
Smoking Cessation Quitting smoking is important to your health and has many advantages. However, it is not always easy to quit since nicotine is a very addictive drug. Oftentimes, people try 3 times or more before being able to quit. This document explains the best ways for you to prepare to quit smoking. Quitting takes hard work and a lot of effort, but you can do it. ADVANTAGES OF QUITTING SMOKING  You will live longer, feel better, and live better.  Your body will feel the impact of quitting smoking almost immediately.  Within 20 minutes, blood pressure decreases. Your pulse returns to its normal level.  After 8 hours, carbon monoxide levels in the blood return to normal. Your oxygen level increases.  After 24 hours, the chance of having a heart attack starts to decrease. Your breath, hair, and body stop smelling like smoke.  After 48 hours, damaged nerve endings begin to recover. Your sense of taste and smell improve.  After 72 hours, the body is virtually free of nicotine. Your bronchial tubes relax and breathing becomes easier.  After 2 to 12 weeks, lungs can hold more air. Exercise becomes easier and circulation improves.  The risk of having a heart attack, stroke, cancer, or lung disease is greatly reduced.  After 1 year, the risk of coronary heart disease is cut in half.  After 5 years, the risk of stroke falls to the same as a nonsmoker.  After 10 years, the risk of lung cancer is cut in half and the risk of other cancers decreases significantly.  After 15 years, the risk of coronary heart disease drops, usually to the level of a nonsmoker.  If you are pregnant, quitting smoking will improve your chances of having a healthy baby.  The people you live with, especially any children, will be healthier.  You will have extra money to spend on things other than cigarettes. QUESTIONS TO THINK ABOUT BEFORE ATTEMPTING TO QUIT You may want to talk about your answers with your  health care provider.  Why do you want to quit?  If you tried to quit in the past, what helped and what did not?  What will be the most difficult situations for you after you quit? How will you plan to handle them?  Who can help you through the tough times? Your family? Friends? A health care provider?  What pleasures do you get from smoking? What ways can you still get pleasure if you quit? Here are some questions to ask your health care provider:  How can you help me to be successful at quitting?  What medicine do you think would be best for me and how should I take it?  What should I do if I need more help?  What is smoking withdrawal like? How can I get information on withdrawal? GET READY  Set a quit date.  Change your environment by getting rid of all cigarettes, ashtrays, matches, and lighters in your home, car, or work. Do not let people smoke in your home.  Review your past attempts to quit. Think about what worked and what did not. GET SUPPORT AND ENCOURAGEMENT You have a better chance of being successful if you have help. You can get support in many ways.  Tell your family, friends, and coworkers that you are going to quit and need their support. Ask them not to smoke around you.  Get individual, group, or telephone counseling and support. Programs are available at [employer] and health centers. Call  your local health department for information about programs in your area.  Spiritual beliefs and practices may help some smokers quit.  Download a "quit meter" on your computer to keep track of quit statistics, such as how long you have gone without smoking, cigarettes not smoked, and money saved.  Get a self-help book about quitting smoking and staying off tobacco. Cove yourself from urges to smoke. Talk to someone, go for a walk, or occupy your time with a task.  Change your normal routine. Take a different route to work.  Drink tea instead of coffee. Eat breakfast in a different place.  Reduce your stress. Take a hot bath, exercise, or read a book.  Plan something enjoyable to do every day. Reward yourself for not smoking.  Explore interactive web-based programs that specialize in helping you quit. GET MEDICINE AND USE IT CORRECTLY Medicines can help you stop smoking and decrease the urge to smoke. Combining medicine with the above behavioral methods and support can greatly increase your chances of successfully quitting smoking.  Nicotine replacement therapy helps deliver nicotine to your body without the negative effects and risks of smoking. Nicotine replacement therapy includes nicotine gum, lozenges, inhalers, nasal sprays, and skin patches. Some may be available over-the-counter and others require a prescription.  Antidepressant medicine helps people abstain from smoking, but how this works is unknown. This medicine is available by prescription.  Nicotinic receptor partial agonist medicine simulates the effect of nicotine in your brain. This medicine is available by prescription. Ask your health care provider for advice about which medicines to use and how to use them based on your health history. Your health care provider will tell you what side effects to look out for if you choose to be on a medicine or therapy. Carefully read the information on the package. Do not use any other product containing nicotine while using a nicotine replacement product.  RELAPSE OR DIFFICULT SITUATIONS Most relapses occur within the first 3 months after quitting. Do not be discouraged if you start smoking again. Remember, most people try several times before finally quitting. You may have symptoms of withdrawal because your body is used to nicotine. You may crave cigarettes, be irritable, feel very hungry, cough often, get headaches, or have difficulty concentrating. The withdrawal symptoms are only temporary. They are strongest  when you first quit, but they will go away within 10-14 days. To reduce the chances of relapse, try to:  Avoid drinking alcohol. Drinking lowers your chances of successfully quitting.  Reduce the amount of caffeine you consume. Once you quit smoking, the amount of caffeine in your body increases and can give you symptoms, such as a rapid heartbeat, sweating, and anxiety.  Avoid smokers because they can make you want to smoke.  Do not let weight gain distract you. Many smokers will gain weight when they quit, usually less than 10 pounds. Eat a healthy diet and stay active. You can always lose the weight gained after you quit.  Find ways to improve your mood other than smoking. FOR MORE INFORMATION  www.smokefree.gov  Document Released: 06/02/2001 Document Revised: 10/23/2013 Document Reviewed: 09/17/2011 [facility] Patient Information 2015 [place], [place]. This information is not intended to replace advice given to you by your health care provider. Make sure you discuss any questions you have with your health care provider. Chronic Obstructive Pulmonary Disease Chronic obstructive pulmonary disease (COPD) is a common lung condition in which airflow from the lungs is limited.  COPD is a general term that can be used to describe many different lung problems that limit airflow, including both chronic bronchitis and emphysema. If you have COPD, your lung function will probably never return to normal, but there are measures you can take to improve lung function and make yourself feel better.  CAUSES   Smoking (common).   Exposure to secondhand smoke.   Genetic problems.  Chronic inflammatory lung diseases or recurrent infections. SYMPTOMS   Shortness of breath, especially with physical activity.   Deep, persistent (chronic) cough with a large amount of thick mucus.   Wheezing.   Rapid breaths (tachypnea).   Gray or bluish discoloration (cyanosis) of the skin, especially in fingers,  toes, or lips.   Fatigue.   Weight loss.   Frequent infections or episodes when breathing symptoms become much worse (exacerbations).   Chest tightness. DIAGNOSIS  Your health care provider will take a medical history and perform a physical examination to make the initial diagnosis. Additional tests for COPD may include:   Lung (pulmonary) function tests.  Chest X-ray.  CT scan.  Blood tests. TREATMENT  Treatment available to help you feel better when you have COPD includes:   Inhaler and nebulizer medicines. These help manage the symptoms of COPD and make your breathing more comfortable.  Supplemental oxygen. Supplemental oxygen is only helpful if you have a low oxygen level in your blood.   Exercise and physical activity. These are beneficial for nearly all people with COPD. Some people may also benefit from a pulmonary rehabilitation program. HOME CARE INSTRUCTIONS   Take all medicines (inhaled or pills) as directed by your health care provider.  Avoid over-the-counter medicines or cough syrups that dry up your airway (such as antihistamines) and slow down the elimination of secretions unless instructed otherwise by your health care provider.   If you are a smoker, the most important thing that you can do is stop smoking. Continuing to smoke will cause further lung damage and breathing trouble. Ask your health care provider for help with quitting smoking. He or she can direct you to community resources or hospitals that provide support.  Avoid exposure to irritants such as smoke, chemicals, and fumes that aggravate your breathing.  Use oxygen therapy and pulmonary rehabilitation if directed by your health care provider. If you require home oxygen therapy, ask your health care provider whether you should purchase a pulse oximeter to measure your oxygen level at home.   Avoid contact with individuals who have a contagious illness.  Avoid extreme temperature and  humidity changes.  Eat healthy foods. Eating smaller, more frequent meals and resting before meals may help you maintain your strength.  Stay active, but balance activity with periods of rest. Exercise and physical activity will help you maintain your ability to do things you want to do.  Preventing infection and hospitalization is very important when you have COPD. Make sure to receive all the vaccines your health care provider recommends, especially the pneumococcal and influenza vaccines. Ask your health care provider whether you need a pneumonia vaccine.  Learn and use relaxation techniques to manage stress.  Learn and use controlled breathing techniques as directed by your health care provider. Controlled breathing techniques include:   Pursed lip breathing. Start by breathing in (inhaling) through your nose for 1 second. Then, purse your lips as if you were going to whistle and breathe out (exhale) through the pursed lips for 2 seconds.   Diaphragmatic breathing. Start by putting  one hand on your abdomen just above your waist. Inhale slowly through your nose. The hand on your abdomen should move out. Then purse your lips and exhale slowly. You should be able to feel the hand on your abdomen moving in as you exhale.   Learn and use controlled coughing to clear mucus from your lungs. Controlled coughing is a series of short, progressive coughs. The steps of controlled coughing are:   Lean your head slightly forward.   Breathe in deeply using diaphragmatic breathing.   Try to hold your breath for 3 seconds.   Keep your mouth slightly open while coughing twice.   Spit any mucus out into a tissue.   Rest and repeat the steps once or twice as needed. SEEK MEDICAL CARE IF:   You are coughing up more mucus than usual.   There is a change in the color or thickness of your mucus.   Your breathing is more labored than usual.   Your breathing is faster than usual.  SEEK  IMMEDIATE MEDICAL CARE IF:   You have shortness of breath while you are resting.   You have shortness of breath that prevents you from:  Being able to talk.   Performing your usual physical activities.   You have chest pain lasting longer than 5 minutes.   Your skin color is more cyanotic than usual.  You measure low oxygen saturations for longer than 5 minutes with a pulse oximeter. MAKE SURE YOU:   Understand these instructions.  Will watch your condition.  Will get help right away if you are not doing well or get worse. Document Released: 03/18/2005 Document Revised: 10/23/2013 Document Reviewed: 02/02/2013 [facility] Patient Information 2015 [place], [place]. This information is not intended to replace advice given to you by your health care provider. Make sure you discuss any questions you have with your health care provider. Health Maintenance Adopting a healthy lifestyle and getting preventive care can go a long way to promote health and wellness. Talk with your health care provider about what schedule of regular examinations is right for you. This is a good chance for you to check in with your provider about disease prevention and staying healthy. In between checkups, there are plenty of things you can do on your own. Experts have done a lot of research about which lifestyle changes and preventive measures are most likely to keep you healthy. Ask your health care provider for more information. WEIGHT AND DIET  Eat a healthy diet  Be sure to include plenty of vegetables, fruits, low-fat dairy products, and lean protein.  Do not eat a lot of foods high in solid fats, added sugars, or salt.  Get regular exercise. This is one of the most important things you can do for your health.  Most adults should exercise for at least 150 minutes each week. The exercise should increase your heart rate and make you sweat (moderate-intensity exercise).  Most adults should also do  strengthening exercises at least twice a week. This is in addition to the moderate-intensity exercise.  Maintain a healthy weight  Body mass index (BMI) is a measurement that can be used to identify possible weight problems. It estimates body fat based on height and weight. Your health care provider can help determine your BMI and help you achieve or maintain a healthy weight.  For females 21 years of age and older:   A BMI below 18.5 is considered underweight.  A BMI of 18.5 to 24.9 is  normal.  A BMI of 25 to 29.9 is considered overweight.  A BMI of 30 and above is considered obese.  Watch levels of cholesterol and blood lipids  You should start having your blood tested for lipids and cholesterol at 57 years of age, then have this test every 5 years.  You may need to have your cholesterol levels checked more often if:  Your lipid or cholesterol levels are high.  You are older than 57 years of age.  You are at high risk for heart disease.  CANCER SCREENING   Lung Cancer  Lung cancer screening is recommended for adults 59-75 years old who are at high risk for lung cancer because of a history of smoking.  A yearly low-dose CT scan of the lungs is recommended for people who:  Currently smoke.  Have quit within the past 15 years.  Have at least a 30-pack-year history of smoking. A pack year is smoking an average of one pack of cigarettes a day for 1 year.  Yearly screening should continue until it has been 15 years since you quit.  Yearly screening should stop if you develop a health problem that would prevent you from having lung cancer treatment.  Breast Cancer  Practice breast self-awareness. This means understanding how your breasts normally appear and feel.  It also means doing regular breast self-exams. Let your health care provider know about any changes, no matter how small.  If you are in your 20s or 30s, you should have a clinical breast exam (CBE) by a  health care provider every 1-3 years as part of a regular health exam.  If you are 43 or older, have a CBE every year. Also consider having a breast X-ray (mammogram) every year.  If you have a family history of breast cancer, talk to your health care provider about genetic screening.  If you are at high risk for breast cancer, talk to your health care provider about having an MRI and a mammogram every year.  Breast cancer gene (BRCA) assessment is recommended for women who have family members with BRCA-related cancers. BRCA-related cancers include:  Breast.  Ovarian.  Tubal.  Peritoneal cancers.  Results of the assessment will determine the need for genetic counseling and BRCA1 and BRCA2 testing. Cervical Cancer Routine pelvic examinations to screen for cervical cancer are no longer recommended for nonpregnant women who are considered low risk for cancer of the pelvic organs (ovaries, uterus, and vagina) and who do not have symptoms. A pelvic examination may be necessary if you have symptoms including those associated with pelvic infections. Ask your health care provider if a screening pelvic exam is right for you.   The Pap test is the screening test for cervical cancer for women who are considered at risk.  If you had a hysterectomy for a problem that was not cancer or a condition that could lead to cancer, then you no longer need Pap tests.  If you are older than 65 years, and you have had normal Pap tests for the past 10 years, you no longer need to have Pap tests.  If you have had past treatment for cervical cancer or a condition that could lead to cancer, you need Pap tests and screening for cancer for at least 20 years after your treatment.  If you no longer get a Pap test, assess your risk factors if they change (such as having a new sexual partner). This can affect whether you should start being screened  again.  Some women have medical problems that increase their chance of  getting cervical cancer. If this is the case for you, your health care provider may recommend more frequent screening and Pap tests.  The human papillomavirus (HPV) test is another test that may be used for cervical cancer screening. The HPV test looks for the virus that can cause cell changes in the cervix. The cells collected during the Pap test can be tested for HPV.  The HPV test can be used to screen women 48 years of age and older. Getting tested for HPV can extend the interval between normal Pap tests from three to five years.  An HPV test also should be used to screen women of any age who have unclear Pap test results.  After 57 years of age, women should have HPV testing as often as Pap tests.  Colorectal Cancer  This type of cancer can be detected and often prevented.  Routine colorectal cancer screening usually begins at 57 years of age and continues through 56 years of age.  Your health care provider may recommend screening at an earlier age if you have risk factors for colon cancer.  Your health care provider may also recommend using home test kits to check for hidden blood in the stool.  A small camera at the end of a tube can be used to examine your colon directly (sigmoidoscopy or colonoscopy). This is done to check for the earliest forms of colorectal cancer.  Routine screening usually begins at age 49.  Direct examination of the colon should be repeated every 5-10 years through 57 years of age. However, you may need to be screened more often if early forms of precancerous polyps or small growths are found. Skin Cancer  Check your skin from head to toe regularly.  Tell your health care provider about any new moles or changes in moles, especially if there is a change in a mole's shape or color.  Also tell your health care provider if you have a mole that is larger than the size of a pencil eraser.  Always use sunscreen. Apply sunscreen liberally and repeatedly  throughout the day.  Protect yourself by wearing long sleeves, pants, a wide-brimmed hat, and sunglasses whenever you are outside. HEART DISEASE, DIABETES, AND HIGH BLOOD PRESSURE   Have your blood pressure checked at least every 1-2 years. High blood pressure causes heart disease and increases the risk of stroke.  If you are between 5 years and 76 years old, ask your health care provider if you should take aspirin to prevent strokes.  Have regular diabetes screenings. This involves taking a blood sample to check your fasting blood sugar level.  If you are at a normal weight and have a low risk for diabetes, have this test once every three years after 57 years of age.  If you are overweight and have a high risk for diabetes, consider being tested at a younger age or more often. PREVENTING INFECTION  Hepatitis B  If you have a higher risk for hepatitis B, you should be screened for this virus. You are considered at high risk for hepatitis B if:  You were born in a country where hepatitis B is common. Ask your health care provider which countries are considered high risk.  Your parents were born in a high-risk country, and you have not been immunized against hepatitis B (hepatitis B vaccine).  You have HIV or AIDS.  You use needles to inject street drugs.  You live with someone who has hepatitis B.  You have had sex with someone who has hepatitis B.  You get hemodialysis treatment.  You take certain medicines for conditions, including cancer, organ transplantation, and autoimmune conditions. Hepatitis C  Blood testing is recommended for:  Everyone born from [birth year] through 1963.  Anyone with known risk factors for hepatitis C. Sexually transmitted infections (STIs)  You should be screened for sexually transmitted infections (STIs) including gonorrhea and chlamydia if:  You are sexually active and are younger than 57 years of age.  You are older than 57 years of age and your  health care provider tells you that you are at risk for this type of infection.  Your sexual activity has changed since you were last screened and you are at an increased risk for chlamydia or gonorrhea. Ask your health care provider if you are at risk.  If you do not have HIV, but are at risk, it may be recommended that you take a prescription medicine daily to prevent HIV infection. This is called pre-exposure prophylaxis (PrEP). You are considered at risk if:  You are sexually active and do not regularly use condoms or know the HIV status of your partner(s).  You take drugs by injection.  You are sexually active with a partner who has HIV. Talk with your health care provider about whether you are at high risk of being infected with HIV. If you choose to begin PrEP, you should first be tested for HIV. You should then be tested every 3 months for as long as you are taking PrEP.  PREGNANCY   If you are premenopausal and you may become pregnant, ask your health care provider about preconception counseling.  If you may become pregnant, take 400 to 800 micrograms (mcg) of folic acid every day.  If you want to prevent pregnancy, talk to your health care provider about birth control (contraception). OSTEOPOROSIS AND MENOPAUSE   Osteoporosis is a disease in which the bones lose minerals and strength with aging. This can result in serious bone fractures. Your risk for osteoporosis can be identified using a bone density scan.  If you are 45 years of age or older, or if you are at risk for osteoporosis and fractures, ask your health care provider if you should be screened.  Ask your health care provider whether you should take a calcium or vitamin D supplement to lower your risk for osteoporosis.  Menopause may have certain physical symptoms and risks.  Hormone replacement therapy may reduce some of these symptoms and risks. Talk to your health care provider about whether hormone replacement  therapy is right for you.  HOME CARE INSTRUCTIONS   Schedule regular health, dental, and eye exams.  Stay current with your immunizations.   Do not use any tobacco products including cigarettes, chewing tobacco, or electronic cigarettes.  If you are pregnant, do not drink alcohol.  If you are breastfeeding, limit how much and how often you drink alcohol.  Limit alcohol intake to no more than 1 drink per day for nonpregnant women. One drink equals 12 ounces of beer, 5 ounces of wine, or 1 ounces of hard liquor.  Do not use street drugs.  Do not share needles.  Ask your health care provider for help if you need support or information about quitting drugs.  Tell your health care provider if you often feel depressed.  Tell your health care provider if you have ever been abused or do not feel  safe at home. Document Released: 12/22/2010 Document Revised: 10/23/2013 Document Reviewed: 05/10/2013 [facility] Patient Information 2015 [place], [place]. This information is not intended to replace advice given to you by your health care provider. Make sure you discuss any questions you have with your health care provider.

## 2014-09-07 LAB — CMP14+EGFR
ALK PHOS: 53 IU/L (ref 39–117)
ALT: 61 IU/L — AB (ref 0–32)
AST: 37 IU/L (ref 0–40)
Albumin/Globulin Ratio: 1.8 (ref 1.1–2.5)
Albumin: 4.7 g/dL (ref 3.5–5.5)
BUN/Creatinine Ratio: 19 (ref 9–23)
BUN: 17 mg/dL (ref 6–24)
Bilirubin Total: 0.6 mg/dL (ref 0.0–1.2)
CALCIUM: 10.1 mg/dL (ref 8.7–10.2)
CHLORIDE: 99 mmol/L (ref 97–108)
CO2: 25 mmol/L (ref 18–29)
Creatinine, Ser: 0.89 mg/dL (ref 0.57–1.00)
GFR calc non Af Amer: 73 mL/min/{1.73_m2} (ref >59)
GFR, EST AFRICAN AMERICAN: 84 mL/min/{1.73_m2} (ref >59)
Globulin, Total: 2.6 g/dL (ref 1.5–4.5)
Glucose: 167 mg/dL — ABNORMAL HIGH (ref 65–99)
Potassium: 4.3 mmol/L (ref 3.5–5.2)
SODIUM: 142 mmol/L (ref 134–144)
Total Protein: 7.3 g/dL (ref 6.0–8.5)

## 2014-09-07 LAB — VITAMIN D 25 HYDROXY (VIT D DEFICIENCY, FRACTURES): Vit D, 25-Hydroxy: 19.4 ng/mL — ABNORMAL LOW (ref 30.0–100.0)

## 2014-09-07 LAB — LIPID PANEL
CHOL/HDL RATIO: 3.2 ratio (ref 0.0–4.4)
Cholesterol, Total: 132 mg/dL (ref 100–199)
HDL: 41 mg/dL (ref >39)
LDL Calculated: 69 mg/dL (ref 0–99)
Triglycerides: 112 mg/dL (ref 0–149)
VLDL Cholesterol Cal: 22 mg/dL (ref 5–40)

## 2014-09-10 ENCOUNTER — Other Ambulatory Visit: Payer: Self-pay | Admitting: Family

## 2014-09-10 ENCOUNTER — Other Ambulatory Visit: Payer: Self-pay | Admitting: *Deleted

## 2014-09-10 DIAGNOSIS — E559 Vitamin D deficiency, unspecified: Secondary | ICD-10-CM | POA: Insufficient documentation

## 2014-09-10 DIAGNOSIS — I10 Essential (primary) hypertension: Secondary | ICD-10-CM

## 2014-09-10 MED ORDER — CLONIDINE HCL 0.3 MG PO TABS: 0.3000 mg | ORAL_TABLET | Freq: Two times a day (BID) | ORAL | Status: DC

## 2014-09-10 MED ORDER — VITAMIN D (ERGOCALCIFEROL) 1.25 MG (50000 UNIT) PO CAPS: 50000.0000 [IU] | ORAL_CAPSULE | ORAL | Status: DC

## 2014-09-10 NOTE — Telephone Encounter (Signed)
Pt aware medication is at pharmacy 

## 2014-09-10 NOTE — Telephone Encounter (Signed)
Called pt about lab results. She asked if [facility]. He meds go to mail order pharmacy and will not be here till next week.

## 2014-09-12 ENCOUNTER — Other Ambulatory Visit: Payer: Self-pay | Admitting: Family

## 2014-09-18 ENCOUNTER — Other Ambulatory Visit: Payer: Self-pay | Admitting: Family

## 2014-09-18 DIAGNOSIS — Z1231 Encounter for screening mammogram for malignant neoplasm of breast: Secondary | ICD-10-CM

## 2014-09-25 ENCOUNTER — Other Ambulatory Visit: Payer: Self-pay | Admitting: Family

## 2014-09-25 ENCOUNTER — Telehealth: Payer: Self-pay | Admitting: Family

## 2014-09-25 DIAGNOSIS — I10 Essential (primary) hypertension: Secondary | ICD-10-CM

## 2014-09-25 NOTE — Telephone Encounter (Signed)
Called patient and she said her BG was 410mg /dL at 32GM11am this morning but she took 60 units of Novolog and it went down to 250mg /dL by 01:0212:45.  Patient was advised to schedule an appointment with Tammy to follow up, however she is waiting for Medicaid coverage and can not afford the payments.  I told her I would ask Tammy to call her tomorrow.

## 2014-09-26 ENCOUNTER — Telehealth: Payer: Self-pay | Admitting: Pharmacist

## 2014-09-26 MED ORDER — INSULIN GLARGINE 300 UNIT/ML ~~LOC~~ SOPN: 65.0000 [IU] | PEN_INJECTOR | Freq: Every day | SUBCUTANEOUS | Status: DC

## 2014-09-26 NOTE — Telephone Encounter (Signed)
Patient was concerned to stop clonidine because she was stated on this to decrease her heart rate and she is very emotional about her youngest son's family situation.  She also started Invokana last month which can also lower BP.  I recommended that she hold Invokana and follow up in 1-2 weeks.

## 2014-09-26 NOTE — Telephone Encounter (Addendum)
This am BG was 297.  Took Novolog 30 units and was down 120 about 2 hours later.  Patient fell twice over the last 2-3 days.  She had her neighbor who is an EMT check her BP yesterday and BP was 90/60.  Recommended that she 1.  Increase Toujeo to 65 units daily 2.  Decrease clonidine to 0.3mg  1 tablet daily

## 2014-09-26 NOTE — Telephone Encounter (Signed)
Patient states she does not have money to come in.  I have her information about [facility] to see if she would qualify for services until Medicaid is instated.  Patient to call back in 1-2 days with BP and BG readings.

## 2014-09-26 NOTE — Telephone Encounter (Signed)
Message handled in another call.

## 2014-09-26 NOTE — Addendum Note (Signed)
Addended by: Henrene PastorECKARD, Viriginia Amendola on: 09/26/2014 04:55 PM   Modules accepted: Orders, Medications

## 2014-10-01 ENCOUNTER — Ambulatory Visit (HOSPITAL_COMMUNITY): Payer: Commercial Managed Care - HMO

## 2014-10-03 ENCOUNTER — Other Ambulatory Visit: Payer: Self-pay | Admitting: Family

## 2014-10-03 MED ORDER — INSULIN ASPART 100 UNIT/ML ~~LOC~~ SOLN: SUBCUTANEOUS | Status: DC

## 2014-10-08 ENCOUNTER — Telehealth: Payer: Self-pay | Admitting: Pharmacist

## 2014-10-08 ENCOUNTER — Other Ambulatory Visit: Payer: Self-pay | Admitting: Pharmacist

## 2014-10-08 DIAGNOSIS — F32A Depression, unspecified: Secondary | ICD-10-CM

## 2014-10-08 DIAGNOSIS — F419 Anxiety disorder, unspecified: Secondary | ICD-10-CM

## 2014-10-08 DIAGNOSIS — F329 Major depressive disorder, single episode, unspecified: Secondary | ICD-10-CM

## 2014-10-08 MED ORDER — CLONAZEPAM 2 MG PO TABS: 2.0000 mg | ORAL_TABLET | Freq: Three times a day (TID) | ORAL | Status: DC | PRN

## 2014-10-08 NOTE — Telephone Encounter (Signed)
Called patient to follow up elevated BG since stopping Invokana and increasing Toujeo to 65 units.  She reports that she has not had any more episodes of dizziness / "blacking out" since stopping Invokana.   BG has been around 200 though.  I instructed patient to increase Toujeo to 70 units once daily.  She also has just found out that Medicaid was reinstated but she has not received card yet.  I will have our front desk check eligibility and if she is covered get appt scheduled for follow up .

## 2014-10-08 NOTE — Telephone Encounter (Signed)
Patient aware rsx is ready to be picked up.

## 2014-10-24 ENCOUNTER — Other Ambulatory Visit: Payer: Self-pay | Admitting: *Deleted

## 2014-10-24 MED ORDER — SERTRALINE HCL 100 MG PO TABS: 100.0000 mg | ORAL_TABLET | Freq: Every day | ORAL | Status: AC

## 2014-10-31 ENCOUNTER — Ambulatory Visit (INDEPENDENT_AMBULATORY_CARE_PROVIDER_SITE_OTHER): Payer: Commercial Managed Care - HMO | Admitting: Physician Assistant

## 2014-10-31 ENCOUNTER — Encounter: Payer: Self-pay | Admitting: Physician Assistant

## 2014-10-31 VITALS — BP 115/75 | HR 84 | Temp 97.8°F

## 2014-10-31 DIAGNOSIS — G629 Polyneuropathy, unspecified: Secondary | ICD-10-CM

## 2014-10-31 DIAGNOSIS — M79604 Pain in right leg: Secondary | ICD-10-CM | POA: Diagnosis not present

## 2014-10-31 MED ORDER — TRAMADOL HCL 50 MG PO TABS: 50.0000 mg | ORAL_TABLET | Freq: Four times a day (QID) | ORAL | Status: DC | PRN

## 2014-10-31 MED ORDER — GABAPENTIN 800 MG PO TABS: 800.0000 mg | ORAL_TABLET | Freq: Three times a day (TID) | ORAL | Status: DC

## 2014-10-31 NOTE — Progress Notes (Signed)
   Subjective:    Patient ID: Gail Preston, female    DOB: Feb 12, 1956, 59 y.o.   MRN: [id number]  HPI59 Y/O female with comorbid DM, peripheral neuropathy, COPD, HTN, CRF,  SIRS and h/o  Encephalopathy presents with c/o weakness and falling d/t parasthesias in BLE and pain in her right buttocks area that radiates down her right leg when she stands. She states that she has been unable to walk and has fell 20 times in the last 4 days. She has been crawling and presents today in a wheelchair. States that she has been crying 24 hours a day the past several days . Presents with DIL    Review of Systems  Constitutional: Positive for activity change and fatigue.  HENT: Negative.   Gastrointestinal: Negative for diarrhea and constipation.  Genitourinary: Negative for difficulty urinating.  Musculoskeletal: Positive for gait problem (can't walk d/t weakness).       Hip pain   Neurological: Positive for weakness and numbness (BLE, intermiitent).  Psychiatric/Behavioral: Positive for dysphoric mood.       Objective:   Physical Exam  Constitutional: She is oriented to person, place, and time. No distress.  Obese    Musculoskeletal: Normal range of motion. She exhibits no edema or tenderness.  Inability to extend bilateral legs but able to hold them up with passive extension at knees while in wheelchair  Neurological: She is alert and oriented to person, place, and time.  Skin: She is not diaphoretic.  Psychiatric: She has a normal mood and affect. Her behavior is normal. Judgment and thought content normal.  Nursing note and vitals reviewed.         Assessment & Plan:  1. Peripheral neuropathy  - Vitamin B12 - gabapentin (NEURONTIN) 800 MG tablet; Take 1 tablet (800 mg total) by mouth 3 (three) times daily.  Dispense: 90 tablet; Refill: 3 - Ambulatory referral to Neurology, urgent   2. Right leg pain  - traMADol (ULTRAM) 50 MG tablet; Take 1 tablet (50 mg total) by mouth every 6  (six) hours as needed for moderate pain.  Dispense: 120 tablet; Refill: 0  Report to ER if s/s worsen prior to neuro appt    Continue all meds Labs pending Health Maintenance reviewed   Tiffany A. Chauncey ReadingGann PA-C

## 2014-11-01 LAB — VITAMIN B12: Vitamin B-12: 349 pg/mL (ref 211–946)

## 2014-11-02 ENCOUNTER — Emergency Department (HOSPITAL_COMMUNITY)
Admission: EM | Admit: 2014-11-02 | Discharge: 2014-11-02 | Disposition: A | Discharge status: Home / Self Care | Payer: Commercial Managed Care - HMO | Attending: Emergency Medicine | Admitting: Emergency Medicine

## 2014-11-02 ENCOUNTER — Encounter (HOSPITAL_COMMUNITY): Payer: Self-pay | Admitting: Emergency Medicine

## 2014-11-02 ENCOUNTER — Ambulatory Visit: Payer: Commercial Managed Care - HMO | Admitting: Neurology

## 2014-11-02 ENCOUNTER — Telehealth: Payer: Self-pay | Admitting: Family

## 2014-11-02 ENCOUNTER — Emergency Department (HOSPITAL_COMMUNITY): Payer: Commercial Managed Care - HMO

## 2014-11-02 ENCOUNTER — Telehealth: Payer: Self-pay | Admitting: Neurology

## 2014-11-02 DIAGNOSIS — Z9889 Other specified postprocedural states: Secondary | ICD-10-CM | POA: Diagnosis not present

## 2014-11-02 DIAGNOSIS — Z880 Allergy status to penicillin: Secondary | ICD-10-CM | POA: Insufficient documentation

## 2014-11-02 DIAGNOSIS — Z8701 Personal history of pneumonia (recurrent): Secondary | ICD-10-CM | POA: Diagnosis not present

## 2014-11-02 DIAGNOSIS — I10 Essential (primary) hypertension: Secondary | ICD-10-CM | POA: Diagnosis not present

## 2014-11-02 DIAGNOSIS — Z794 Long term (current) use of insulin: Secondary | ICD-10-CM | POA: Diagnosis not present

## 2014-11-02 DIAGNOSIS — S99921A Unspecified injury of right foot, initial encounter: Secondary | ICD-10-CM | POA: Insufficient documentation

## 2014-11-02 DIAGNOSIS — M797 Fibromyalgia: Secondary | ICD-10-CM | POA: Diagnosis not present

## 2014-11-02 DIAGNOSIS — Y998 Other external cause status: Secondary | ICD-10-CM | POA: Diagnosis not present

## 2014-11-02 DIAGNOSIS — Z7982 Long term (current) use of aspirin: Secondary | ICD-10-CM | POA: Insufficient documentation

## 2014-11-02 DIAGNOSIS — W19XXXA Unspecified fall, initial encounter: Secondary | ICD-10-CM

## 2014-11-02 DIAGNOSIS — Z79899 Other long term (current) drug therapy: Secondary | ICD-10-CM | POA: Insufficient documentation

## 2014-11-02 DIAGNOSIS — S99922A Unspecified injury of left foot, initial encounter: Secondary | ICD-10-CM | POA: Insufficient documentation

## 2014-11-02 DIAGNOSIS — F329 Major depressive disorder, single episode, unspecified: Secondary | ICD-10-CM | POA: Insufficient documentation

## 2014-11-02 DIAGNOSIS — E785 Hyperlipidemia, unspecified: Secondary | ICD-10-CM | POA: Insufficient documentation

## 2014-11-02 DIAGNOSIS — S8992XA Unspecified injury of left lower leg, initial encounter: Secondary | ICD-10-CM | POA: Diagnosis not present

## 2014-11-02 DIAGNOSIS — S3992XA Unspecified injury of lower back, initial encounter: Secondary | ICD-10-CM | POA: Insufficient documentation

## 2014-11-02 DIAGNOSIS — M5136 Other intervertebral disc degeneration, lumbar region: Secondary | ICD-10-CM | POA: Diagnosis not present

## 2014-11-02 DIAGNOSIS — Y92008 Other place in unspecified non-institutional (private) residence as the place of occurrence of the external cause: Secondary | ICD-10-CM | POA: Diagnosis not present

## 2014-11-02 DIAGNOSIS — S8991XA Unspecified injury of right lower leg, initial encounter: Secondary | ICD-10-CM | POA: Diagnosis not present

## 2014-11-02 DIAGNOSIS — E119 Type 2 diabetes mellitus without complications: Secondary | ICD-10-CM | POA: Diagnosis not present

## 2014-11-02 DIAGNOSIS — Z720 Tobacco use: Secondary | ICD-10-CM | POA: Diagnosis not present

## 2014-11-02 DIAGNOSIS — W08XXXA Fall from other furniture, initial encounter: Secondary | ICD-10-CM | POA: Diagnosis not present

## 2014-11-02 DIAGNOSIS — Y9389 Activity, other specified: Secondary | ICD-10-CM | POA: Diagnosis not present

## 2014-11-02 NOTE — ED Provider Notes (Signed)
CSN: [id number]     Arrival date & time 11/02/14  1130 History  This chart was scribed for non-physician practitioner Teressa LowerVrinda Mayling Aber, NP working with Benjiman CoreNathan Jahid Weida, MD, by Tanda RockersMargaux Venter, ED Scribe. This patient was seen in room WTR6/WTR6 and the patient's care was started at 1:14 PM.     Chief Complaint  Patient presents with  . Leg Pain   The history is provided by the patient. No language interpreter was used.     HPI Comments: Gail LinesDonna Preston is a 59 y.o. female brought in by ambulance, who presents to the Emergency Department complaining of bilateral leg pain that began Sunday, 5/7 (approximately 7 days ago). She states that she was getting off of the couch and fell, causing the pain. She also complains of bilateral foot pain, knee pain, and lower back pain. Pt mentions that she is unable to ambulate. [facility] and was told it could be B12 deficiency. Pt referred to see neurologist and had scheduled appointment today at 12:30 PM.  She was given prescription for Ultram at that time which she states has not been helping. Pt mentions that she slid out of chair this morning today and son called EMS to bring pt to the ED instead of waiting to see neurologist. Family member reports that pt has been unable to move her feet and they have to move them for her to transfer her. They also mentions that pt fell 20 times within the past 4 days.    Past Medical History  Diagnosis Date  . Fibromyalgia   . Hypertension   . Hyperlipidemia   . Pneumonia   . Diabetes mellitus without complication   . Depression    Past Surgical History  Procedure Laterality Date  . Tubal ligation    . Fracture surgery    . Tonsillectomy Bilateral   . Carpal tunnel release Right   . Cardiac catheterization     Family History  Problem Relation Age of Onset  . Cancer Mother     melanoma  . Heart disease Father   . Heart attack Father [id number]  . Depression Sister   . Schizophrenia  Sister   . Bipolar disorder Sister   . Drug abuse Son     recovered  . Drug abuse Son    History  Substance Use Topics  . Smoking status: Current Every Day Smoker -- 0.50 packs/day for 12 years  . Smokeless tobacco: Not on file  . Alcohol Use: No   OB History    No data available     Review of Systems  Musculoskeletal: Positive for arthralgias (Bilateral leg pain, bilateral knee pain, bilateral foot pain. ) and gait problem.  Neurological: Positive for weakness.  All other systems reviewed and are negative.     Allergies  Nsaids and Penicillins  Home Medications   Prior to Admission medications   Medication Sig Start Date End Date Taking? Authorizing Provider  ACCU-CHEK AVIVA PLUS test strip Inject 1 each into the skin as directed.  05/21/14  Yes Historical Provider, MD  amLODipine (NORVASC) 10 MG tablet Take 10 mg by mouth daily. 05/21/14  Yes Historical Provider, MD  aspirin EC 81 MG tablet Take 1 tablet (81 mg total) by mouth daily. 08/10/14  Yes Tammy Eckard, PHARMD  atorvastatin (LIPITOR) 40 MG tablet Take 40 mg by mouth daily.   Yes Historical Provider, MD  benazepril-hydrochlorthiazide (LOTENSIN HCT) 20-25 MG per tablet Take 1 tablet by  mouth daily. 02/22/14  Yes Junie Spencerhristy A Hawks, FNP  carvedilol (COREG) 25 MG tablet Take 1 tablet (25 mg total) by mouth 2 (two) times daily with a meal. 02/22/14  Yes Junie Spencerhristy A Hawks, FNP  clonazePAM (KLONOPIN) 2 MG tablet Take 1 tablet (2 mg total) by mouth 3 (three) times daily as needed. Patient taking differently: Take 2 mg by mouth 3 (three) times daily as needed for anxiety.  10/08/14  Yes Junie Spencerhristy A Hawks, FNP  cloNIDine (CATAPRES) 0.3 MG tablet Take 1 tablet (0.3 mg total) by mouth 2 (two) times daily. 09/10/14  Yes Junie Spencerhristy A Hawks, FNP  gabapentin (NEURONTIN) 800 MG tablet Take 1 tablet (800 mg total) by mouth 3 (three) times daily. 10/31/14  Yes Tiffany A Gann, PA-C  insulin aspart (NOVOLOG) 100 UNIT/ML injection INJECT 5 UNITS BID FOR  BLOOD GLUCOSE > 250. CALL OFFICE FOR INSULIN ADJUSTMENT IF NEEDED MORE THAN 2 TIMES  PER WEEK. Patient taking differently: Inject 5 Units into the skin 2 (two) times daily. FOR BLOOD GLUCOSE > 250. CALL OFFICE FOR INSULIN ADJUSTMENT IF NEEDED MORE THAN 2 TIMES  PER WEEK. 10/03/14  Yes Junie Spencerhristy A Hawks, FNP  Insulin Glargine (TOUJEO SOLOSTAR) 300 UNIT/ML SOPN Inject 65 Units into the skin daily. Patient taking differently: Inject 80 Units into the skin daily.  09/26/14  Yes Tammy Eckard, PHARMD  Insulin Pen Needle (PEN NEEDLES) 31G X 6 MM MISC Use with insulin pens to inject insulin up to qid.  Dx  E11.9  Type 2 DM treated with insulin 05/11/14  Yes Tammy Eckard, PHARMD  Melatonin 5 MG TABS Take 5 mg by mouth at bedtime as needed (sleep).  02/21/14  Yes Historical Provider, MD  metFORMIN (GLUCOPHAGE) 1000 MG tablet Take 0.5 tablets (500 mg total) by mouth 2 (two) times daily with a meal. Patient taking differently: Take 1,000 mg by mouth 2 (two) times daily with a meal.  08/13/14  Yes Tammy Eckard, PHARMD  sertraline (ZOLOFT) 100 MG tablet Take 1 tablet (100 mg total) by mouth daily. 10/24/14  Yes Junie Spencerhristy A Hawks, FNP  traMADol (ULTRAM) 50 MG tablet Take 1 tablet (50 mg total) by mouth every 6 (six) hours as needed for moderate pain. 10/31/14  Yes Tiffany A Gann, PA-C  ipratropium-albuterol (DUONEB) 0.5-2.5 (3) MG/3ML SOLN Take 3 mLs by nebulization every 2 (two) hours as needed. Patient not taking: Reported on 10/31/2014 03/28/14   Nita SellsMaryann Mikhail, DO  Umeclidinium Bromide (INCRUSE ELLIPTA) 62.5 MCG/INH AEPB Inhale 1 puff into the lungs daily. Patient not taking: Reported on 11/02/2014 08/10/14   Junie Spencerhristy A Hawks, FNP  Vitamin D, Ergocalciferol, (DRISDOL) 50000 UNITS CAPS capsule Take 1 capsule (50,000 Units total) by mouth every 7 (seven) days. Patient not taking: Reported on 11/02/2014 09/10/14   Junie Spencerhristy A Hawks, FNP   Triage Vitals: BP 112/49 mmHg  Pulse 72  Temp(Src) 98.1 F (36.7 C) (Oral)  Resp 16  SpO2  91%   Physical Exam  Constitutional: She is oriented to person, place, and time. She appears well-developed and well-nourished. No distress.  HENT:  Head: Normocephalic and atraumatic.  Eyes: Conjunctivae and EOM are normal.  Neck: Neck supple. No tracheal deviation present.  Cardiovascular: Normal rate.   Pulmonary/Chest: Effort normal. No respiratory distress.  Musculoskeletal: Normal range of motion.  Pt states that she can't lift leg. When left lifted she is able to hold them up bilaterally and hold then up against pressure. Pt is able to flex and extend foot bilaterally  Neurological: She is alert and oriented to person, place, and time. She exhibits normal muscle tone. Coordination normal.  Skin: Skin is warm and dry.  Psychiatric: She has a normal mood and affect. Her behavior is normal.  Nursing note and vitals reviewed.   ED Course  Procedures (including critical care time)  DIAGNOSTIC STUDIES: Oxygen Saturation is 91% on RA, normal by my interpretation.    COORDINATION OF CARE: 1:18 PM-Discussed treatment plan which includes DG L Spine with pt at bedside and pt agreed to plan.   Labs Review Labs Reviewed - No data to display  Imaging Review Dg Lumbar Spine Complete  11/02/2014   CLINICAL DATA:  Pt complains of lower back pain that radiates into her legs and inability to walk after a fall on 10/28/2014.  EXAM: LUMBAR SPINE - COMPLETE 4+ VIEW  COMPARISON:  CT, 11/02/2012  FINDINGS: No fracture. No spondylolisthesis. Moderate to marked loss of disc height at L4-L5 with endplate sclerosis and osteophytes. Remaining lumbar disc spaces are well preserved. Facet joints are relatively well preserved.  Soft tissues are unremarkable other than mild aortic vascular calcifications.  IMPRESSION: 1. No fracture or acute finding. 2. Disk degenerative changes at L4-L5. Findings stable from the prior CT.   Electronically Signed   By: Amie Portland M.D.   On: 11/02/2014 14:23     EKG  Interpretation None      MDM   Final diagnoses:  Degenerative disc disease, lumbar  Falls, initial encounter    Pt wheelchair arranged to be delivered at home for so many falls. Discussed with pt that no different pain medication would given as she was given ultram from pcp 2 days ago. Discussed with pt the importance of follow up with neurology  I personally performed the services described in this documentation, which was scribed in my presence. The recorded information has been reviewed and is accurate.      Teressa Lower, NP 11/02/14 1441  Benjiman Core, MD 11/02/14 1550

## 2014-11-02 NOTE — ED Notes (Signed)
Case manager at bedside to help obtain w/c for pt.

## 2014-11-02 NOTE — Telephone Encounter (Signed)
Pt no showed today's NP appt w/ Dr. Everlena CooperJaffe. Tiffany Mann's office notified via [facility] referral / Sherri S.

## 2014-11-02 NOTE — Progress Notes (Addendum)
59 yr old [place]  report recent falls with bilateral lower extremity weakness, numb feet Pt has been seen by pcp and given ultram for pain but son and daughter [facility] in stead of taking her to the scheduled neurologist appt on today off [address] Reports recent fall in bathroom and having to crawl naked with grandchild present Daughter tearful.  Son had to pick pt up today to assist prior to ED visit arrival  ED PA/NP recommends just as pcp recommended pt be seen by a neurologist  Pt's daughter requesting different med other that tramadol provided by pcp on 11/01/14  CM consulted for w/c CM spoke with pt and daughter to get choice if they want delivery or a Rx from EDP/PA/NP to pick up a w/c for pt until she can get to neurologist  Pt had an appointment today CM discussed importance of getting pt to neurologist appointment and that this can be done using pt access to the county medicaid transportation services Encouraged daughter to call pcp office to get pt another pain medication EDP can not offer more than ultram at this time Daughter called pcp office Pending return call  1427 CM spoke with Darel HongJudy and Abbie at [facility] at [phone] and [phone] to provide referral for wheelchair for pt ht 5'7" wt 262 lbs [employer] to run pt coverage an d call pt to discuss deliver or pick up in store option for today CM provided pt with [facility] contact information and medicaid [place] transportation information for d/c instructions Cm asked if any questions None from pt or duaughter Referral completed in pt room with pt and dtr via cm mobile  ED RN and NP/PA/EDP updated   1446 Cm called Dr Milana KidneyA Jaffe office [phone] [facility] Spoke with Alcario Droughtrica to attempt to reschedule appt but it has to be discussed with Dr Everlena CooperJaffe first Cm informed pt can call the office 11/05/14 to check This was entered in pt d/c instructions An in basket note sent to Dr Everlena CooperJaffe and  pcp  1500 confirmed fax clinicals to [facility] [phone]

## 2014-11-02 NOTE — Discharge Instructions (Signed)
You need to make an appointment with the neurologist again Degenerative Disk Disease Degenerative disk disease is a condition caused by the changes that occur in the cushions of the backbone (spinal disks) as you grow older. Spinal disks are soft and compressible disks located between the bones of the spine (vertebrae). They act like shock absorbers. Degenerative disk disease can affect the whole spine. However, the neck and lower back are most commonly affected. Many changes can occur in the spinal disks with aging, such as:  The spinal disks may dry and shrink.  Small tears may occur in the tough, outer covering of the disk (annulus).  The disk space may become smaller due to loss of water.  Abnormal growths in the bone (spurs) may occur. This can put pressure on the nerve roots exiting the spinal canal, causing pain.  The spinal canal may become narrowed. CAUSES  Degenerative disk disease is a condition caused by the changes that occur in the spinal disks with aging. The exact cause is not known, but there is a genetic basis for many patients. Degenerative changes can occur due to loss of fluid in the disk. This makes the disk thinner and reduces the space between the backbones. Small cracks can develop in the outer layer of the disk. This can lead to the breakdown of the disk. You are more likely to get degenerative disk disease if you are overweight. Smoking cigarettes and doing heavy work such as weightlifting can also increase your risk of this condition. Degenerative changes can start after a sudden injury. Growth of bone spurs can compress the nerve roots and cause pain.  SYMPTOMS  The symptoms vary from person to person. Some people may have no pain, while others have severe pain. The pain may be so severe that it can limit your activities. The location of the pain depends on the part of your backbone that is affected. You will have neck or arm pain if a disk in the neck area is affected.  You will have pain in your back, buttocks, or legs if a disk in the lower back is affected. The pain becomes worse while bending, reaching up, or with twisting movements. The pain may start gradually and then get worse as time passes. It may also start after a major or minor injury. You may feel numbness or tingling in the arms or legs.  DIAGNOSIS  Your caregiver will ask you about your symptoms and about activities or habits that may cause the pain. He or she may also ask about any injuries, diseases, or treatments you have had earlier. Your caregiver will examine you to check for the range of movement that is possible in the affected area, to check for strength in your extremities, and to check for sensation in the areas of the arms and legs supplied by different nerve roots. An X-ray of the spine may be taken. Your caregiver may suggest other imaging tests, such as magnetic resonance imaging (MRI), if needed.  TREATMENT  Treatment includes rest, modifying your activities, and applying ice and heat. Your caregiver may prescribe medicines to reduce your pain and may ask you to do some exercises to strengthen your back. In some cases, you may need surgery. You and your caregiver will decide on the treatment that is best for you. HOME CARE INSTRUCTIONS   Follow proper lifting and walking techniques as advised by your caregiver.  Maintain good posture.  Exercise regularly as advised.  Perform relaxation exercises.  Change  your sitting, standing, and sleeping habits as advised. Change positions frequently.  Lose weight as advised.  Stop smoking if you smoke.  Wear supportive footwear. SEEK MEDICAL CARE IF:  Your pain does not go away within 1 to 4 weeks. SEEK IMMEDIATE MEDICAL CARE IF:   Your pain is severe.  You notice weakness in your arms, hands, or legs.  You begin to lose control of your bladder or bowel movements. MAKE SURE YOU:   Understand these instructions.  Will watch your  condition.  Will get help right away if you are not doing well or get worse. Document Released: 04/05/2007 Document Revised: 08/31/2011 Document Reviewed: 10/10/2013 [facility] Patient Information 2015 [employer] [place]. This information is not intended to replace advice given to you by your health care provider. Make sure you discuss any questions you have with your health care provider.

## 2014-11-02 NOTE — ED Notes (Addendum)
Pt c/o exacerbation of chronic bilateral leg pain and numbness x 5 days, states that today the pain prevented her from ambulating.

## 2014-11-02 NOTE — Telephone Encounter (Signed)
Stp who states she was told at the ER that they wouldn't give her any pain meds as we would need to do that. The ER note clearly states no further pain meds would be prescribed since she was given Ultram by us and they explained the importance of following up with Neurology. Pt's tone of voiced completely changed once she was advised we could see ER notes and then she hung up on me.

## 2014-11-02 NOTE — ED Notes (Signed)
Bed: [facility] Expected date:  Expected time:  Means of arrival:  Comments: 

## 2014-12-07 ENCOUNTER — Telehealth: Payer: Self-pay | Admitting: *Deleted

## 2014-12-07 MED ORDER — CYCLOBENZAPRINE HCL 10 MG PO TABS: 10.0000 mg | ORAL_TABLET | Freq: Three times a day (TID) | ORAL | Status: AC | PRN

## 2014-12-07 NOTE — Telephone Encounter (Signed)
Prescription sent to pharmacy.

## 2014-12-07 NOTE — Telephone Encounter (Signed)
Patient is getting ready to be discharged home from the [facility] and is needing a follow up appt. Appt was made  And social worker states that she has had back surgery and they do not send home pain meds with patient. Social worker wants to know if we can send in some flexeril for patient. Please advise and call [facility] at [phone] and ask for social worker

## 2014-12-10 ENCOUNTER — Ambulatory Visit: Payer: Commercial Managed Care - HMO | Admitting: Family

## 2014-12-14 ENCOUNTER — Encounter: Payer: Self-pay | Admitting: Family

## 2014-12-14 ENCOUNTER — Ambulatory Visit (INDEPENDENT_AMBULATORY_CARE_PROVIDER_SITE_OTHER): Payer: Commercial Managed Care - HMO | Admitting: Family

## 2014-12-14 VITALS — BP 110/64 | HR 76 | Temp 98.0°F

## 2014-12-14 DIAGNOSIS — M9956 Intervertebral disc stenosis of neural canal of lower extremity: Secondary | ICD-10-CM

## 2014-12-14 DIAGNOSIS — M545 Low back pain: Secondary | ICD-10-CM

## 2014-12-14 MED ORDER — HYDROCODONE-ACETAMINOPHEN 7.5-325 MG PO TABS: 1.0000 | ORAL_TABLET | Freq: Four times a day (QID) | ORAL | Status: DC | PRN

## 2014-12-14 NOTE — Progress Notes (Signed)
   Subjective:    Patient ID: Janila Sylla, female    DOB: [birth year], 59 y.o.   MRN: [id number]  HPI Pt presents to the office today for follow up. Pt had back surgery on L5 and L6. Pt was then to [facility] in [place] for Rehab. Pt is getting PT and OT at home. Pt states she will be getting OT three times a week and PT is to come on Monday to evaluate  her needs at home. Pt states she is using a walker and wheelchair at home.    Review of Systems  Constitutional: Negative.   HENT: Negative.   Eyes: Negative.   Respiratory: Negative.  Negative for shortness of breath.   Cardiovascular: Negative.  Negative for palpitations.  Gastrointestinal: Negative.   Endocrine: Negative.   Genitourinary: Negative.   Musculoskeletal: Negative.   Neurological: Negative.  Negative for headaches.  Hematological: Negative.   Psychiatric/Behavioral: Negative.   All other systems reviewed and are negative.      Objective:   Physical Exam  Constitutional: She is oriented to person, place, and time. She appears well-developed and well-nourished. No distress.  HENT:  Head: Normocephalic and atraumatic.  Eyes: Pupils are equal, round, and reactive to light.  Neck: Normal range of motion. Neck supple. No thyromegaly present.  Cardiovascular: Normal rate, regular rhythm, normal heart sounds and intact distal pulses.   No murmur heard. Pulmonary/Chest: Effort normal and breath sounds normal. No respiratory distress. She has no wheezes.  Abdominal: Soft. Bowel sounds are normal. She exhibits no distension. There is no tenderness.  Musculoskeletal: Normal range of motion. She exhibits no edema or tenderness.       Right shoulder: She exhibits decreased strength (BLE).  Neurological: She is alert and oriented to person, place, and time. She has normal reflexes. No cranial nerve deficit.  Skin: Skin is warm and dry.  Psychiatric: She has a normal mood and affect. Her behavior is normal. Judgment and thought  content normal.  Vitals reviewed.   BP 110/64 mmHg  Pulse 76  Temp(Src) 98 F (36.7 C) (Oral)  Wt        Assessment & Plan:  1. 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  Dispense: 60 tablet; Refill: 0  2. Midline low back pain, with sciatica presence unspecified - HYDROcodone-acetaminophen (NORCO) 7.5-325 MG per tablet; Take 1 tablet by mouth every 6 (six) hours as needed for moderate pain.  Dispense: 60 tablet; Refill: 0  Continue working with OT and PT Keep all follow up with surgeon RTO as needed and keep chronic follow up appts  Jannifer Rodney, FNP

## 2014-12-14 NOTE — Patient Instructions (Addendum)
  Occupational Therapy Exercises   Both arms straight out in front of you, keep your elbows straight do not let them bend, the criss/cross your arms in front of you.  Both arms straight out in front of you, keep elbows straight.  Bend your wrist back and make small circles in the air.  Go in one direction the go in the other (Like you are waxing something)  Both arms straight out in front of you, keep elbows straight.  Bend your wrists back and move your arm up while the left arm goes down as quick as you can.  With your right/left arm straight out in front of you, keep your elbow straight.  Write your name in the air 3-4 times quickly.  Put both of your hands on your lap with your palms down, turn your palms up and down as fast as you can.  Put both of your hands on your lap keeping your wrists on your lap at all times.  No pat on your lap quickly alternating right/left.  Put your hands on your lap and drum your fingers.  Make sure your fingers are moving individually.  Play with a deck of cards. (Shuffle, deal cards, turn cards over pick up off table one by one)

## 2015-01-25 LAB — HM DIABETES EYE EXAM

## 2015-01-28 ENCOUNTER — Other Ambulatory Visit: Payer: Self-pay | Admitting: Family Medicine

## 2015-01-28 ENCOUNTER — Telehealth: Payer: Self-pay | Admitting: Family

## 2015-01-28 NOTE — Telephone Encounter (Signed)
Done today.

## 2015-02-04 ENCOUNTER — Encounter: Payer: Self-pay | Admitting: *Deleted

## 2015-02-19 ENCOUNTER — Encounter: Payer: Self-pay | Admitting: Family

## 2015-02-19 ENCOUNTER — Ambulatory Visit (INDEPENDENT_AMBULATORY_CARE_PROVIDER_SITE_OTHER): Payer: Commercial Managed Care - HMO | Admitting: Family

## 2015-02-19 VITALS — BP 158/82 | HR 80 | Temp 98.6°F | Ht 66.0 in | Wt 251.8 lb

## 2015-02-19 DIAGNOSIS — F419 Anxiety disorder, unspecified: Secondary | ICD-10-CM

## 2015-02-19 DIAGNOSIS — F329 Major depressive disorder, single episode, unspecified: Secondary | ICD-10-CM | POA: Diagnosis not present

## 2015-02-19 DIAGNOSIS — E1142 Type 2 diabetes mellitus with diabetic polyneuropathy: Secondary | ICD-10-CM

## 2015-02-19 DIAGNOSIS — E1165 Type 2 diabetes mellitus with hyperglycemia: Secondary | ICD-10-CM

## 2015-02-19 DIAGNOSIS — J441 Chronic obstructive pulmonary disease with (acute) exacerbation: Secondary | ICD-10-CM | POA: Diagnosis not present

## 2015-02-19 DIAGNOSIS — M9956 Intervertebral disc stenosis of neural canal of lower extremity: Secondary | ICD-10-CM | POA: Diagnosis not present

## 2015-02-19 DIAGNOSIS — E1129 Type 2 diabetes mellitus with other diabetic kidney complication: Secondary | ICD-10-CM

## 2015-02-19 DIAGNOSIS — J449 Chronic obstructive pulmonary disease, unspecified: Secondary | ICD-10-CM | POA: Diagnosis not present

## 2015-02-19 DIAGNOSIS — M5442 Lumbago with sciatica, left side: Secondary | ICD-10-CM | POA: Diagnosis not present

## 2015-02-19 DIAGNOSIS — Z1211 Encounter for screening for malignant neoplasm of colon: Secondary | ICD-10-CM

## 2015-02-19 DIAGNOSIS — IMO0002 Reserved for concepts with insufficient information to code with codable children: Secondary | ICD-10-CM

## 2015-02-19 DIAGNOSIS — F32A Depression, unspecified: Secondary | ICD-10-CM

## 2015-02-19 DIAGNOSIS — M545 Low back pain: Secondary | ICD-10-CM

## 2015-02-19 DIAGNOSIS — E559 Vitamin D deficiency, unspecified: Secondary | ICD-10-CM | POA: Diagnosis not present

## 2015-02-19 DIAGNOSIS — E1149 Type 2 diabetes mellitus with other diabetic neurological complication: Secondary | ICD-10-CM | POA: Diagnosis not present

## 2015-02-19 DIAGNOSIS — I10 Essential (primary) hypertension: Secondary | ICD-10-CM

## 2015-02-19 DIAGNOSIS — E785 Hyperlipidemia, unspecified: Secondary | ICD-10-CM | POA: Diagnosis not present

## 2015-02-19 DIAGNOSIS — IMO0001 Reserved for inherently not codable concepts without codable children: Secondary | ICD-10-CM

## 2015-02-19 LAB — POCT GLYCOSYLATED HEMOGLOBIN (HGB A1C): Hemoglobin A1C: 7.2

## 2015-02-19 MED ORDER — HYDROCODONE-ACETAMINOPHEN 7.5-325 MG PO TABS: 1.0000 | ORAL_TABLET | Freq: Two times a day (BID) | ORAL | Status: AC | PRN

## 2015-02-19 MED ORDER — CLONAZEPAM 2 MG PO TABS: 2.0000 mg | ORAL_TABLET | Freq: Three times a day (TID) | ORAL | Status: AC | PRN

## 2015-02-19 NOTE — Addendum Note (Signed)
Addended by: Prescott Gum on: 02/19/2015 05:12 PM   Modules accepted: Kipp Brood

## 2015-02-19 NOTE — Patient Instructions (Signed)

## 2015-02-19 NOTE — Progress Notes (Signed)
Subjective:    Patient ID: Gail Preston, female    DOB: July 26, 1955, 59 y.o.   MRN: [id number]  Pt presents to the office today for chronic follow up. Pt is followed by neurologists every month. Pt had surgery end of May 2016 L4 and L5. Pt states she has "run out of pain medication" and requesting pain medication. Pt states she has contact her neurologist and is waiting to hear back. She states she has an appt Sept 30th.  Diabetes She presents for her follow-up diabetic visit. She has type 2 diabetes mellitus. Her disease course has been fluctuating. Hypoglycemia symptoms include nervousness/anxiousness. Pertinent negatives for hypoglycemia include no headaches. Associated symptoms include foot paresthesias. Pertinent negatives for diabetes include no blurred vision, no foot ulcerations and no visual change. Pertinent negatives for hypoglycemia complications include no blackouts. Symptoms are stable. Diabetic complications include a CVA, nephropathy and peripheral neuropathy. Risk factors for coronary artery disease include diabetes mellitus, dyslipidemia, family history, obesity, sedentary lifestyle, post-menopausal, stress and tobacco exposure. Current diabetic treatment includes insulin injections and oral agent (monotherapy). Her breakfast blood glucose range is generally 110-130 mg/dl. An ACE inhibitor/angiotensin II receptor blocker is being taken. Eye exam is current (Jan 25 2015).  Hypertension This is a chronic problem. The current episode started more than 1 year ago. The problem has been waxing and waning since onset. The problem is uncontrolled. Associated symptoms include anxiety. Pertinent negatives include no blurred vision, headaches, palpitations, peripheral edema or shortness of breath. Risk factors for coronary artery disease include diabetes mellitus, dyslipidemia, post-menopausal state, family history and smoking/tobacco exposure. Past treatments include calcium channel blockers, ACE  inhibitors, beta blockers and diuretics. The current treatment provides mild improvement. Hypertensive end-organ damage includes kidney disease and CVA. There is no history of CAD/MI, heart failure or a thyroid problem. There is no history of sleep apnea.  Hyperlipidemia This is a chronic problem. The current episode started more than 1 year ago. The problem is controlled. Recent lipid tests were reviewed and are normal. Exacerbating diseases include diabetes. Factors aggravating her hyperlipidemia include smoking. Pertinent negatives include no leg pain, myalgias or shortness of breath. Current antihyperlipidemic treatment includes statins. The current treatment provides significant improvement of lipids. Risk factors for coronary artery disease include diabetes mellitus, dyslipidemia, family history, hypertension, obesity, post-menopausal and a sedentary lifestyle.  Anxiety Presents for follow-up visit. Onset was 1 to 6 months ago. The problem has been waxing and waning. Symptoms include depressed mood, excessive worry, insomnia, nervous/anxious behavior and restlessness. Patient reports no palpitations or shortness of breath. Symptoms occur occasionally.   Her past medical history is significant for anxiety/panic attacks and depression. Past treatments include benzodiazephines and SSRIs. The treatment provided moderate relief. Compliance with prior treatments has been good.  Depression      The patient presents with depression.  This is a chronic problem.  The current episode started more than 1 year ago.   The onset quality is sudden.   The problem occurs intermittently.  The problem has been waxing and waning since onset.  Associated symptoms include insomnia, restlessness and sad.  Associated symptoms include no helplessness, no hopelessness, no myalgias and no headaches.  Past treatments include SSRIs - Selective serotonin reuptake inhibitors.  Past medical history includes anxiety and depression.      Pertinent negatives include no thyroid problem. Back Pain This is a chronic problem. The current episode started more than 1 year ago. The problem occurs constantly. The problem has been waxing  and waning since onset. The pain is present in the lumbar spine. The quality of the pain is described as aching. The pain is moderate. Pertinent negatives include no headaches or leg pain. She has tried muscle relaxant and analgesics for the symptoms. The treatment provided mild relief.      Review of Systems  Constitutional: Negative.   HENT: Negative.   Eyes: Negative.  Negative for blurred vision.  Respiratory: Negative.  Negative for shortness of breath.   Cardiovascular: Negative.  Negative for palpitations.  Gastrointestinal: Negative.   Endocrine: Negative.   Genitourinary: Negative.   Musculoskeletal: Positive for back pain. Negative for myalgias.  Neurological: Negative.  Negative for headaches.  Hematological: Negative.   Psychiatric/Behavioral: Positive for depression. The patient is nervous/anxious and has insomnia.   All other systems reviewed and are negative.      Objective:   Physical Exam  Constitutional: She is oriented to person, place, and time. She appears well-developed and well-nourished. No distress.  HENT:  Head: Normocephalic and atraumatic.  Right Ear: External ear normal.  Left Ear: External ear normal.  Nose: Nose normal.  Mouth/Throat: Oropharynx is clear and moist.  Eyes: Pupils are equal, round, and reactive to light.  Neck: Normal range of motion. Neck supple. No thyromegaly present.  Cardiovascular: Normal rate, regular rhythm, normal heart sounds and intact distal pulses.   No murmur heard. Pulmonary/Chest: Effort normal and breath sounds normal. No respiratory distress. She has no wheezes.  Abdominal: Soft. Bowel sounds are normal. She exhibits no distension. There is no tenderness.  Musculoskeletal: Normal range of motion. She exhibits edema (trace  amt in BLE). She exhibits no tenderness.  Pt walking with a walker and limited ROM with bending  Neurological: She is alert and oriented to person, place, and time. She has normal reflexes. No cranial nerve deficit.  Skin: Skin is warm and dry.  Psychiatric: She has a normal mood and affect. Her behavior is normal. Judgment and thought content normal.  Vitals reviewed.   Blood pressure 158/82, pulse 80, temperature 98.6 F (37 C), temperature source Oral, height _0  (1.676 m), weight 251 lb 12.8 oz (114.216 kg).       Assessment & Plan:  1. Essential hypertension - CMP14+EGFR  2. COPD with acute exacerbation - CMP14+EGFR  3. COPD bronchitis - CMP14+EGFR  4. DM (diabetes mellitus), type 2, uncontrolled, with renal complications - POCT glycosylated hemoglobin (Hb A1C) - CMP14+EGFR  5. Peripheral sensory neuropathy due to type 2 diabetes mellitus - CMP14+EGFR  6. Hyperlipidemia LDL goal <100 - CMP14+EGFR - Lipid panel  7. Depression - CMP14+EGFR - clonazePAM (KLONOPIN) 2 MG tablet; Take 1 tablet (2 mg total) by mouth 3 (three) times daily as needed for anxiety.  Dispense: 90 tablet; Refill: 3  8. Anxiety - CMP14+EGFR - clonazePAM (KLONOPIN) 2 MG tablet; Take 1 tablet (2 mg total) by mouth 3 (three) times daily as needed for anxiety.  Dispense: 90 tablet; Refill: 3  9. Midline low back pain with left-sided sciatica - CMP14+EGFR  10. Vitamin D deficiency - CMP14+EGFR  11. 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  Dispense: 60 tablet; Refill: 0  12. Midline low back pain, with sciatica presence unspecified - CMP14+EGFR - HYDROcodone-acetaminophen (NORCO) 7.5-325 MG per tablet; Take 1 tablet by mouth every 12 (twelve) hours as needed for moderate pain.  Dispense: 60 tablet; Refill: 0  13.  Colon cancer screening - Fecal occult blood, imunochemical; Future   Continue all meds Labs pending Health Maintenance reviewed Diet and exercise encouraged RTO 3 months   Evelina Dun, FNP

## 2015-02-20 ENCOUNTER — Other Ambulatory Visit: Payer: Self-pay | Admitting: Family

## 2015-02-20 LAB — CMP14+EGFR
A/G RATIO: 1.5 (ref 1.1–2.5)
ALK PHOS: 72 IU/L (ref 39–117)
ALT: 108 IU/L — ABNORMAL HIGH (ref 0–32)
AST: 69 IU/L — AB (ref 0–40)
Albumin: 4.4 g/dL (ref 3.5–5.5)
BILIRUBIN TOTAL: 0.3 mg/dL (ref 0.0–1.2)
BUN/Creatinine Ratio: 29 — ABNORMAL HIGH (ref 9–23)
BUN: 26 mg/dL — ABNORMAL HIGH (ref 6–24)
CHLORIDE: 104 mmol/L (ref 97–108)
CO2: 23 mmol/L (ref 18–29)
Calcium: 10.2 mg/dL (ref 8.7–10.2)
Creatinine, Ser: 0.9 mg/dL (ref 0.57–1.00)
GFR calc Af Amer: 82 mL/min/{1.73_m2} (ref >59)
GFR calc non Af Amer: 71 mL/min/{1.73_m2} (ref >59)
GLOBULIN, TOTAL: 3 g/dL (ref 1.5–4.5)
Glucose: 169 mg/dL — ABNORMAL HIGH (ref 65–99)
POTASSIUM: 4 mmol/L (ref 3.5–5.2)
SODIUM: 147 mmol/L — AB (ref 134–144)
Total Protein: 7.4 g/dL (ref 6.0–8.5)

## 2015-02-20 LAB — LIPID PANEL
CHOLESTEROL TOTAL: 209 mg/dL — AB (ref 100–199)
Chol/HDL Ratio: 6.3 ratio units — ABNORMAL HIGH (ref 0.0–4.4)
HDL: 33 mg/dL — ABNORMAL LOW (ref >39)
LDL Calculated: 128 mg/dL — ABNORMAL HIGH (ref 0–99)
TRIGLYCERIDES: 238 mg/dL — AB (ref 0–149)
VLDL Cholesterol Cal: 48 mg/dL — ABNORMAL HIGH (ref 5–40)

## 2015-04-02 ENCOUNTER — Other Ambulatory Visit: Payer: Self-pay | Admitting: Family

## 2015-04-02 ENCOUNTER — Other Ambulatory Visit: Payer: Self-pay | Admitting: Physician Assistant

## 2015-04-03 ENCOUNTER — Other Ambulatory Visit: Payer: Self-pay | Admitting: *Deleted

## 2015-04-03 ENCOUNTER — Other Ambulatory Visit: Payer: Self-pay | Admitting: Pharmacist

## 2015-04-03 ENCOUNTER — Telehealth: Payer: Self-pay | Admitting: Physician Assistant

## 2015-04-03 DIAGNOSIS — B1920 Unspecified viral hepatitis C without hepatic coma: Secondary | ICD-10-CM

## 2015-04-03 DIAGNOSIS — R768 Other specified abnormal immunological findings in serum: Secondary | ICD-10-CM

## 2015-04-03 MED ORDER — AMLODIPINE BESYLATE 10 MG PO TABS: 10.0000 mg | ORAL_TABLET | Freq: Every day | ORAL | Status: DC

## 2015-04-03 MED ORDER — METFORMIN HCL 1000 MG PO TABS: 1000.0000 mg | ORAL_TABLET | Freq: Two times a day (BID) | ORAL | Status: DC

## 2015-04-04 DIAGNOSIS — B1920 Unspecified viral hepatitis C without hepatic coma: Secondary | ICD-10-CM | POA: Insufficient documentation

## 2015-04-04 NOTE — Telephone Encounter (Signed)
Pt called in about lab results that were mailed to her, question about the liver function tests being elevated. She does take tylenol or Goody Powders on a regular basis due to her back surgery that was done back in May. Is there something else she can take or should she just continue w/ the tylenol.

## 2015-04-04 NOTE — Telephone Encounter (Signed)
Pt called and left message on voicemail concerning lab work. Pt is Hep C positive and referral placed

## 2015-04-04 NOTE — Telephone Encounter (Signed)
Pt aware of [name]'s note and referral to GI

## 2015-04-10 ENCOUNTER — Telehealth: Payer: Self-pay | Admitting: Family

## 2015-05-09 ENCOUNTER — Telehealth: Payer: Self-pay | Admitting: Family

## 2015-05-09 NOTE — Telephone Encounter (Signed)
noted 

## 2015-05-23 ENCOUNTER — Ambulatory Visit: Payer: Self-pay | Admitting: Family

## 2015-07-08 ENCOUNTER — Telehealth: Payer: Self-pay | Admitting: Physician Assistant

## 2015-08-16 ENCOUNTER — Telehealth: Payer: Self-pay | Admitting: Family

## 2015-08-16 DIAGNOSIS — I10 Essential (primary) hypertension: Secondary | ICD-10-CM

## 2015-08-16 MED ORDER — CARVEDILOL 25 MG PO TABS: ORAL_TABLET | ORAL | Status: DC

## 2015-08-16 MED ORDER — AMLODIPINE BESYLATE 10 MG PO TABS: 10.0000 mg | ORAL_TABLET | Freq: Every day | ORAL | Status: DC

## 2015-08-16 MED ORDER — INSULIN GLARGINE 300 UNIT/ML ~~LOC~~ SOPN: 80.0000 [IU] | PEN_INJECTOR | Freq: Every day | SUBCUTANEOUS | Status: DC

## 2015-08-16 MED ORDER — CLONIDINE HCL 0.3 MG PO TABS: 0.3000 mg | ORAL_TABLET | Freq: Two times a day (BID) | ORAL | Status: DC

## 2015-08-16 MED ORDER — METFORMIN HCL 1000 MG PO TABS: 1000.0000 mg | ORAL_TABLET | Freq: Two times a day (BID) | ORAL | Status: DC

## 2015-08-16 MED ORDER — INSULIN ASPART 100 UNIT/ML ~~LOC~~ SOLN: 5.0000 [IU] | Freq: Two times a day (BID) | SUBCUTANEOUS | Status: AC

## 2015-08-16 NOTE — Telephone Encounter (Signed)
RX sent to pharmacy  

## 2015-08-16 NOTE — Telephone Encounter (Signed)
Patient informed that medications were sent in for her

## 2015-09-06 ENCOUNTER — Other Ambulatory Visit: Payer: Self-pay | Admitting: Family

## 2015-09-10 ENCOUNTER — Telehealth: Payer: Self-pay | Admitting: Family

## 2015-09-10 NOTE — Telephone Encounter (Signed)
Left patient a voicemail to call back to schedule follow up before further refills will be given.

## 2015-09-13 ENCOUNTER — Other Ambulatory Visit: Payer: Self-pay | Admitting: Family

## 2015-09-23 ENCOUNTER — Other Ambulatory Visit: Payer: Self-pay | Admitting: Family

## 2015-09-24 NOTE — Telephone Encounter (Signed)
Last seen 02/19/15  Gail Preston 

## 2015-10-09 ENCOUNTER — Other Ambulatory Visit: Payer: Self-pay | Admitting: Family

## 2015-10-09 NOTE — Telephone Encounter (Signed)
Last seen 01/2015 

## 2015-10-09 NOTE — Telephone Encounter (Signed)
Last seen 02/19/15  Gail Preston 

## 2015-10-10 MED ORDER — GABAPENTIN 800 MG PO TABS: ORAL_TABLET | ORAL | Status: AC

## 2015-10-10 NOTE — Telephone Encounter (Signed)
Okay to send her for weeks worth but then she will need to be seen if she wants any more, I see that she's moved elsewhere and has not been seen here since August. She needs a visit with us or a different doctor soon because we do not know where her diabetes is.

## 2015-10-10 NOTE — Telephone Encounter (Signed)
Patient aware that she will have to be seen.  

## 2015-10-21 ENCOUNTER — Other Ambulatory Visit: Payer: Self-pay | Admitting: Family

## 2015-10-22 NOTE — Telephone Encounter (Signed)
Last seen 02/19/15  [name] 

## 2015-11-22 ENCOUNTER — Telehealth: Payer: Self-pay | Admitting: Family

## 2015-11-22 DIAGNOSIS — I10 Essential (primary) hypertension: Secondary | ICD-10-CM

## 2015-11-22 MED ORDER — CARVEDILOL 25 MG PO TABS: 25.0000 mg | ORAL_TABLET | Freq: Two times a day (BID) | ORAL | Status: DC

## 2015-11-22 MED ORDER — CLONIDINE HCL 0.3 MG PO TABS: 0.3000 mg | ORAL_TABLET | Freq: Two times a day (BID) | ORAL | Status: AC

## 2015-11-22 NOTE — Telephone Encounter (Signed)
Pt Patient NTBS for follow up and lab work. Will not refill any more medications.

## 2015-11-22 NOTE — Telephone Encounter (Signed)
Patient last seen June 2016 

## 2015-11-25 ENCOUNTER — Ambulatory Visit: Payer: Self-pay | Admitting: Family

## 2015-11-26 ENCOUNTER — Encounter: Payer: Self-pay | Admitting: Family

## 2015-11-29 NOTE — Telephone Encounter (Signed)
Patient no showed appointment for Monday 6/5.

## 2015-11-29 NOTE — Telephone Encounter (Signed)
Patient no showed on 6/5

## 2015-12-12 ENCOUNTER — Telehealth: Payer: Self-pay | Admitting: Family

## 2015-12-25 ENCOUNTER — Other Ambulatory Visit: Payer: Self-pay | Admitting: Family

## 2015-12-30 ENCOUNTER — Other Ambulatory Visit: Payer: Self-pay | Admitting: Family

## 2015-12-31 ENCOUNTER — Other Ambulatory Visit: Payer: Self-pay | Admitting: Family

## 2015-12-31 NOTE — Telephone Encounter (Signed)
Last seen 02/19/15  [facility]

## 2016-01-01 NOTE — Telephone Encounter (Signed)
Last seen 02/19/15  [name] 

## 2016-01-10 ENCOUNTER — Telehealth: Payer: Self-pay | Admitting: Family

## 2016-01-24 ENCOUNTER — Other Ambulatory Visit: Payer: Self-pay | Admitting: Family

## 2016-01-24 DIAGNOSIS — I10 Essential (primary) hypertension: Secondary | ICD-10-CM

## 2016-01-27 ENCOUNTER — Other Ambulatory Visit: Payer: Self-pay | Admitting: Family

## 2016-01-27 ENCOUNTER — Telehealth: Payer: Self-pay | Admitting: Family

## 2016-01-27 DIAGNOSIS — F419 Anxiety disorder, unspecified: Secondary | ICD-10-CM

## 2016-01-27 DIAGNOSIS — F329 Major depressive disorder, single episode, unspecified: Secondary | ICD-10-CM

## 2016-01-27 DIAGNOSIS — F32A Depression, unspecified: Secondary | ICD-10-CM

## 2016-01-27 NOTE — Telephone Encounter (Signed)
Pt aware.

## 2016-01-27 NOTE — Telephone Encounter (Signed)
PT needs appt

## 2016-01-28 ENCOUNTER — Ambulatory Visit: Payer: Self-pay | Admitting: Family

## 2016-01-29 ENCOUNTER — Encounter: Payer: Self-pay | Admitting: Family

## 2016-02-21 ENCOUNTER — Other Ambulatory Visit: Payer: Self-pay | Admitting: Family

## 2016-02-27 ENCOUNTER — Other Ambulatory Visit: Payer: Self-pay | Admitting: Family

## 2016-02-27 NOTE — Telephone Encounter (Signed)
CH  - hasn't been seen in 1 year

## 2016-03-23 ENCOUNTER — Other Ambulatory Visit: Payer: Self-pay | Admitting: Family

## 2016-03-23 DIAGNOSIS — I10 Essential (primary) hypertension: Secondary | ICD-10-CM

## 2016-03-24 ENCOUNTER — Other Ambulatory Visit: Payer: Self-pay | Admitting: Family

## 2016-03-24 NOTE — Telephone Encounter (Signed)
LMOVM that pt NTBS before refill can be done

## 2016-04-27 ENCOUNTER — Other Ambulatory Visit: Payer: Self-pay | Admitting: Family

## 2016-09-02 ENCOUNTER — Telehealth: Payer: Self-pay | Admitting: Family

## 2016-09-21 IMAGING — CR DG LUMBAR SPINE COMPLETE 4+V
5 series · 5 of 5 positions shown · non-contrast
Comparison: CT, 11/02/2012

CLINICAL DATA: Pt complains of lower back pain that radiates into
her legs and inability to walk after a fall on 10/28/2014.

EXAM:
LUMBAR SPINE - COMPLETE 4+ VIEW

[t lumbar spine ap]
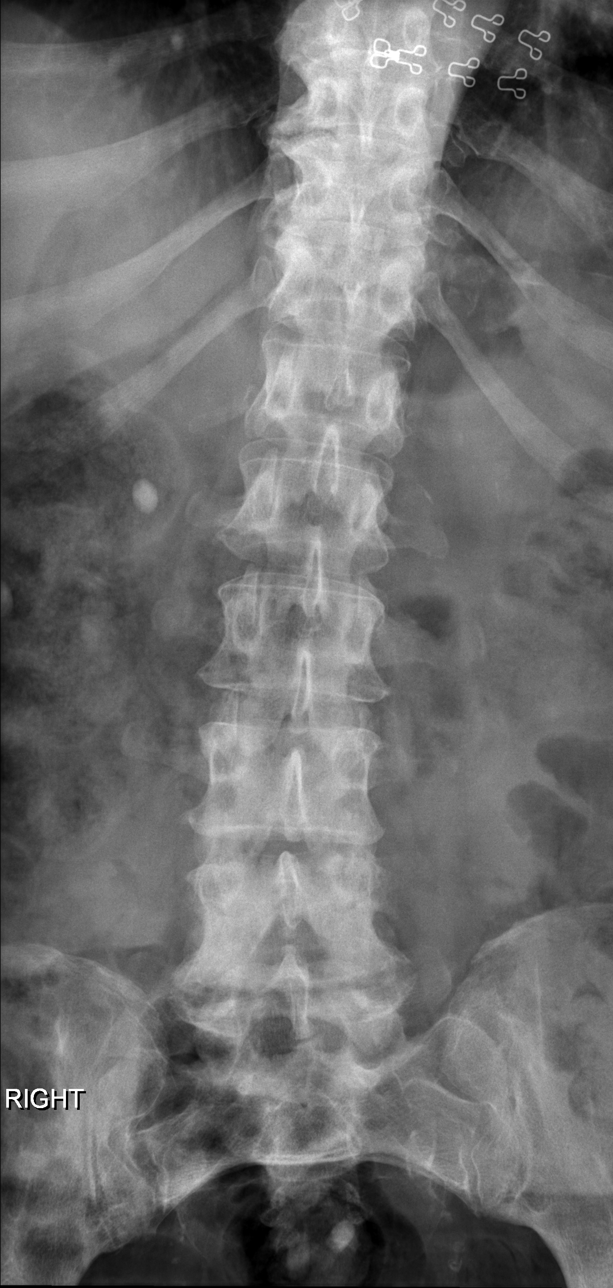

[t lumbar spine obl (1 of 2)]
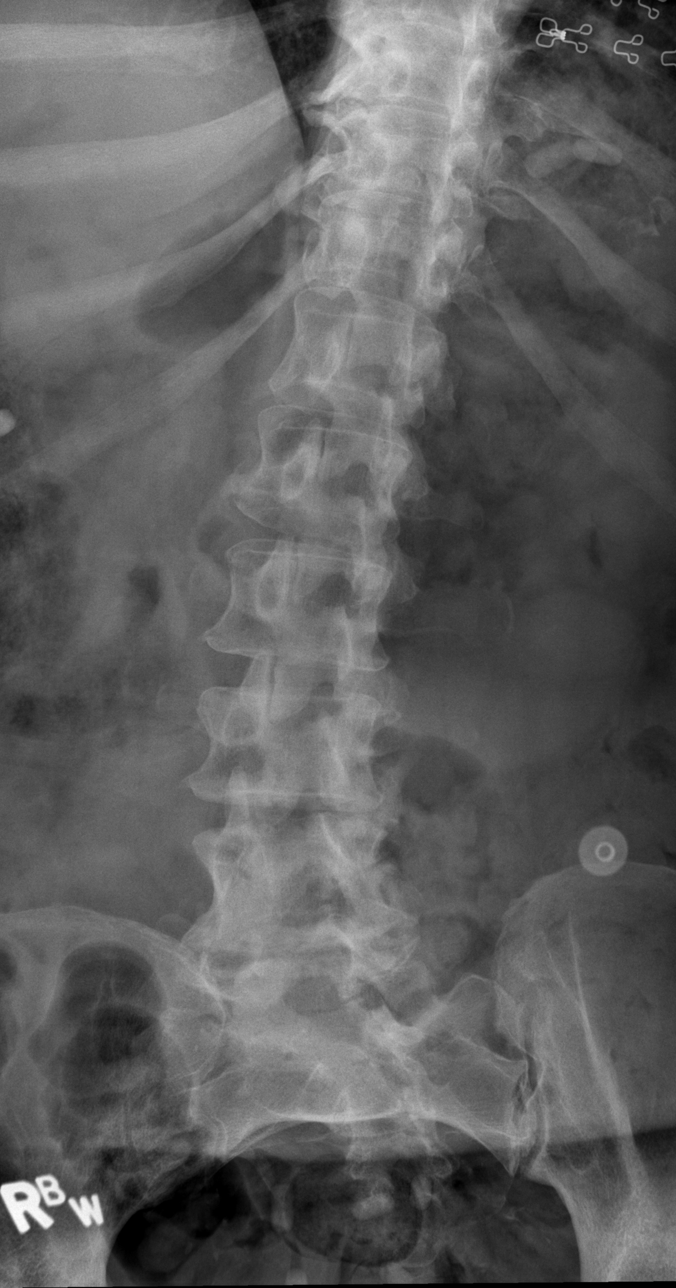

[t lumbar spine obl (2 of 2)]
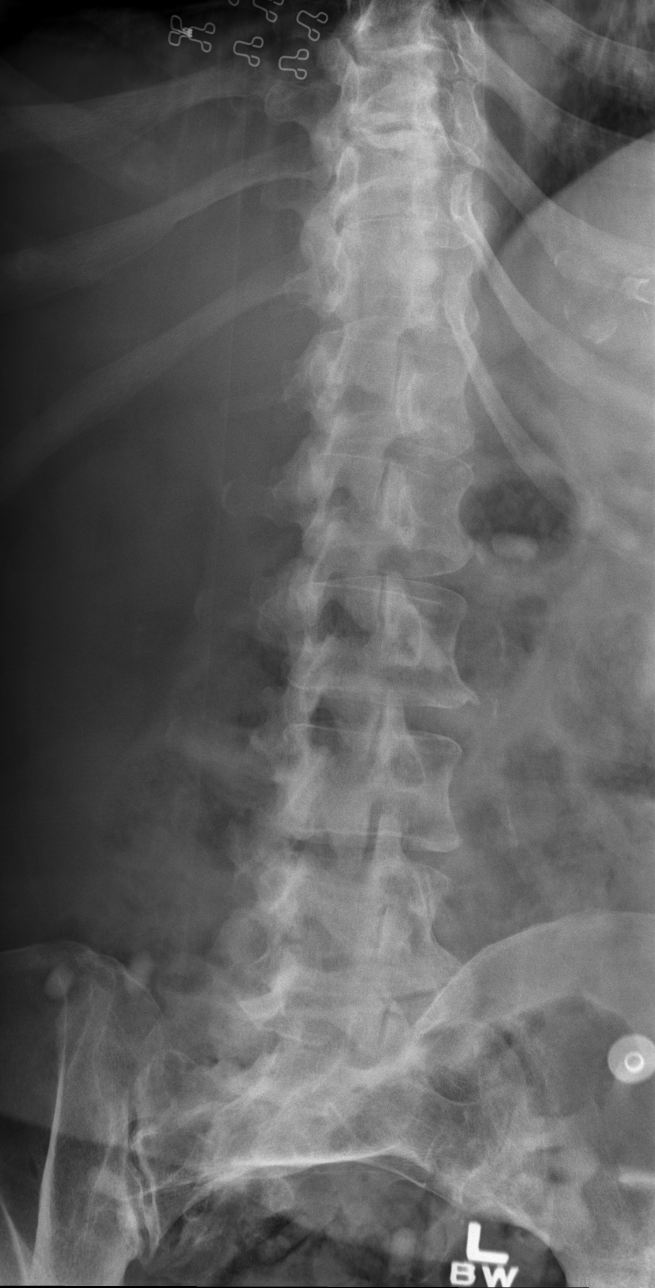

[t lumbar spine lat]
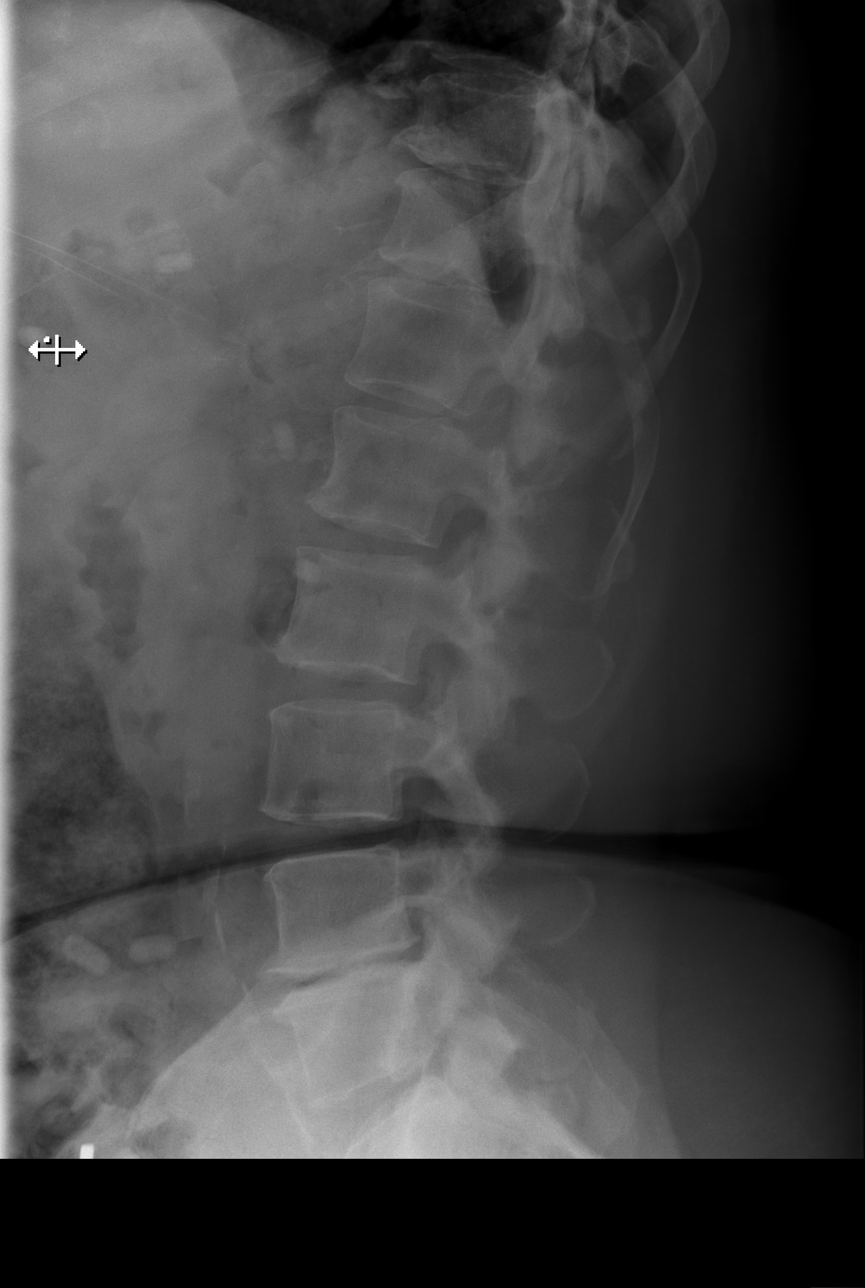

[t lumbar l-5 s-1 spot]
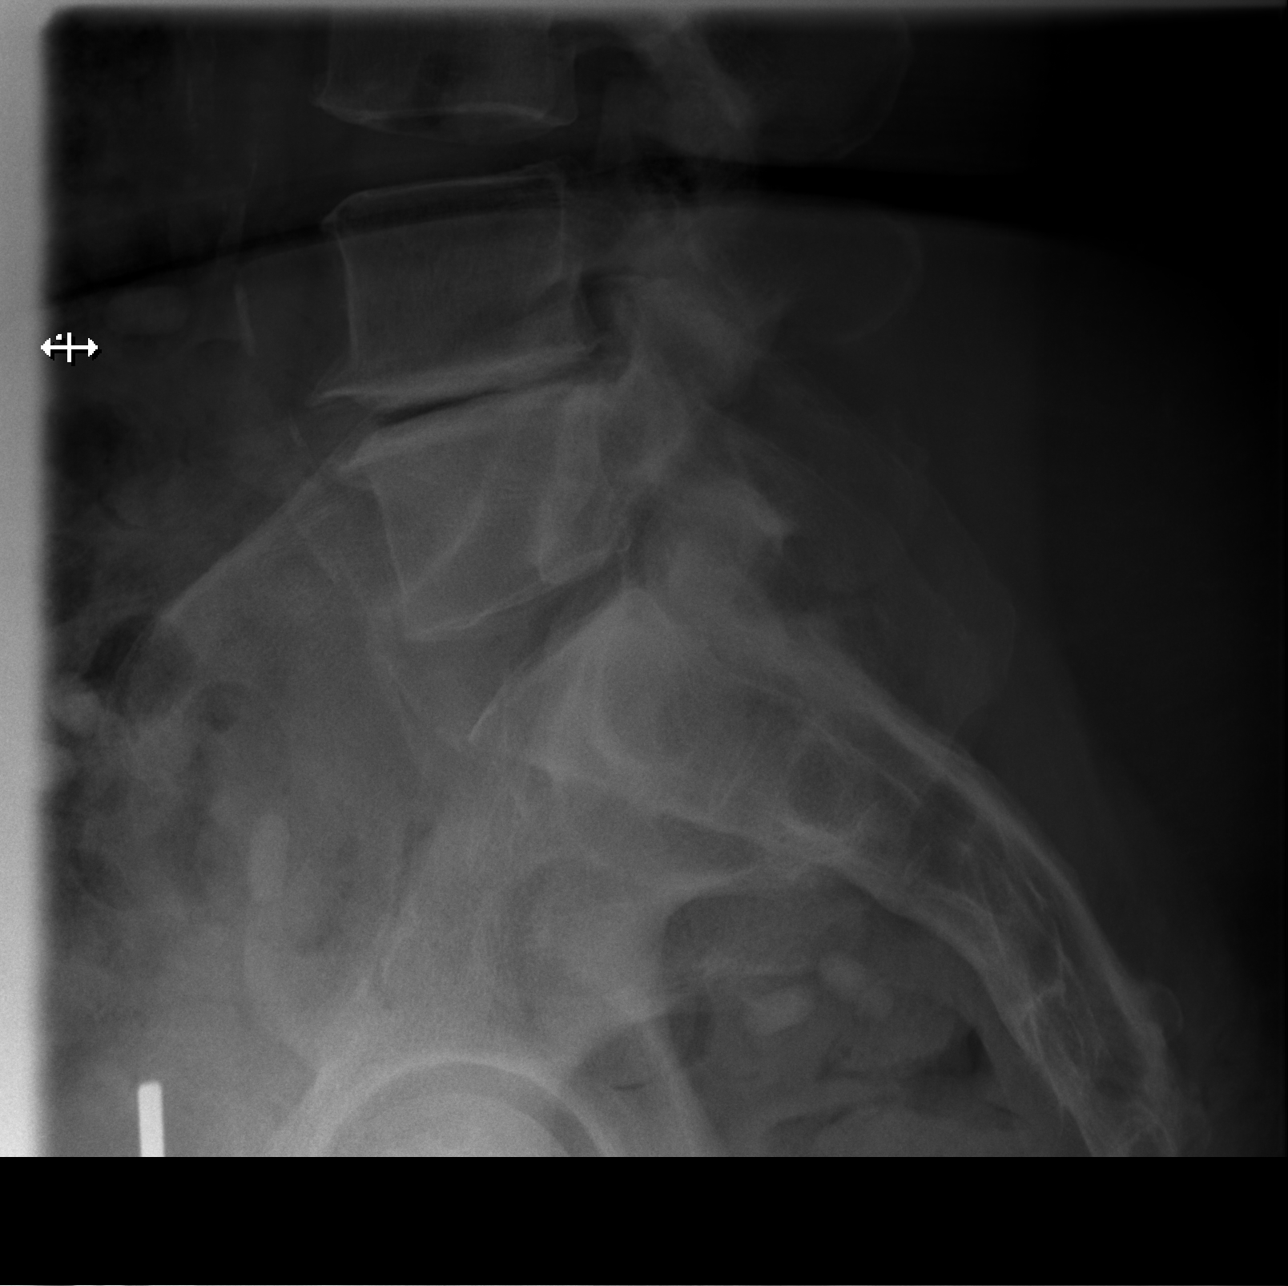

[5 of 5 positions shown; findings below may reference images not displayed]

FINDINGS: No fracture. No spondylolisthesis. Moderate to marked loss of disc
height at L4-L5 with endplate sclerosis and osteophytes. Remaining
lumbar disc spaces are well preserved. Facet joints are relatively
well preserved.

Soft tissues are unremarkable other than mild aortic vascular
calcifications.
IMPRESSION: 1. No fracture or acute finding.
2. Disk degenerative changes at L4-L5. Findings stable from the
prior CT.

## 2017-05-09 MED ORDER — NICOTINE 21 MG/24HR TD PT24
MEDICATED_PATCH | TRANSDERMAL | Status: DC
Start: 2017-05-08 — End: 2017-05-09

## 2017-05-09 MED ORDER — AMLODIPINE BESYLATE 10 MG PO TABS
10.00 | ORAL_TABLET | ORAL | Status: DC
Start: 2017-05-08 — End: 2017-05-09

## 2017-05-09 MED ORDER — MIRTAZAPINE 15 MG PO TABS
15.00 | ORAL_TABLET | ORAL | Status: DC
Start: 2017-05-07 — End: 2017-05-09

## 2017-05-09 MED ORDER — ASPIRIN 81 MG PO CHEW
81.00 | CHEWABLE_TABLET | ORAL | Status: DC
Start: 2017-05-08 — End: 2017-05-09

## 2017-05-09 MED ORDER — LIDOCAINE 5 % EX PTCH
MEDICATED_PATCH | CUTANEOUS | Status: DC
Start: 2017-05-07 — End: 2017-05-09

## 2017-05-09 MED ORDER — NITROGLYCERIN 0.4 MG SL SUBL
0.40 | SUBLINGUAL_TABLET | SUBLINGUAL | Status: DC
Start: ? — End: 2017-05-09

## 2017-05-09 MED ORDER — CLOTRIMAZOLE 1 % EX CREA
TOPICAL_CREAM | CUTANEOUS | Status: DC
Start: 2017-05-07 — End: 2017-05-09

## 2017-05-09 MED ORDER — ACETAMINOPHEN 325 MG PO TABS
650.00 | ORAL_TABLET | ORAL | Status: DC
Start: ? — End: 2017-05-09

## 2017-05-09 MED ORDER — SODIUM CHLORIDE 0.9 % IV SOLN
INTRAVENOUS | Status: DC
Start: ? — End: 2017-05-09

## 2017-05-09 MED ORDER — ANTACID & ANTIGAS 200-200-20 MG/5ML PO SUSP
30.00 | ORAL | Status: DC
Start: ? — End: 2017-05-09

## 2017-05-09 MED ORDER — FAMOTIDINE 20 MG PO TABS
20.00 | ORAL_TABLET | ORAL | Status: DC
Start: ? — End: 2017-05-09

## 2017-05-09 MED ORDER — GENERIC EXTERNAL MEDICATION
Status: DC
Start: ? — End: 2017-05-09

## 2017-05-09 MED ORDER — INSULIN GLARGINE 100 UNIT/ML ~~LOC~~ SOLN
SUBCUTANEOUS | Status: DC
Start: 2017-05-07 — End: 2017-05-09

## 2017-05-09 MED ORDER — INSULIN LISPRO 100 UNIT/ML ~~LOC~~ SOLN
SUBCUTANEOUS | Status: DC
Start: ? — End: 2017-05-09

## 2017-05-09 MED ORDER — ENOXAPARIN SODIUM 40 MG/0.4ML ~~LOC~~ SOLN
40.00 | SUBCUTANEOUS | Status: DC
Start: 2017-05-07 — End: 2017-05-09

## 2017-05-09 MED ORDER — OXYCODONE HCL 5 MG PO TABS
5.00 | ORAL_TABLET | ORAL | Status: DC
Start: ? — End: 2017-05-09

## 2017-05-09 MED ORDER — CLONIDINE HCL 0.1 MG PO TABS
0.10 | ORAL_TABLET | ORAL | Status: DC
Start: 2017-05-07 — End: 2017-05-09

## 2017-05-09 MED ORDER — GABAPENTIN 300 MG PO CAPS
300.00 | ORAL_CAPSULE | ORAL | Status: DC
Start: 2017-05-07 — End: 2017-05-09

## 2017-05-09 MED ORDER — MELATONIN 1 MG PO TABS
1.00 | ORAL_TABLET | ORAL | Status: DC
Start: 2017-05-07 — End: 2017-05-09

## 2017-05-09 MED ORDER — CARVEDILOL 12.5 MG PO TABS
12.50 | ORAL_TABLET | ORAL | Status: DC
Start: 2017-05-07 — End: 2017-05-09

## 2017-05-09 MED ORDER — INSULIN LISPRO 100 UNIT/ML ~~LOC~~ SOLN
SUBCUTANEOUS | Status: DC
Start: 2017-05-07 — End: 2017-05-09

## 2017-05-09 MED ORDER — PROMETHAZINE HCL 25 MG/ML IJ SOLN
12.50 | INTRAMUSCULAR | Status: DC
Start: ? — End: 2017-05-09
